# Patient Record
Sex: Female | Born: 1950
Health system: Southern US, Community
[De-identification: ages and names within clinical notes are randomized; demographics above are authoritative.]

## PROBLEM LIST (undated history)

## (undated) DIAGNOSIS — R519 Headache, unspecified: Secondary | ICD-10-CM

## (undated) DIAGNOSIS — G43909 Migraine, unspecified, not intractable, without status migrainosus: Secondary | ICD-10-CM

## (undated) DIAGNOSIS — M8580 Other specified disorders of bone density and structure, unspecified site: Secondary | ICD-10-CM

## (undated) DIAGNOSIS — M419 Scoliosis, unspecified: Secondary | ICD-10-CM

## (undated) DIAGNOSIS — R51 Headache: Secondary | ICD-10-CM

## (undated) DIAGNOSIS — M797 Fibromyalgia: Secondary | ICD-10-CM

## (undated) HISTORY — DX: Fibromyalgia: M79.7

## (undated) HISTORY — DX: Migraine, unspecified, not intractable, without status migrainosus: G43.909

## (undated) HISTORY — PX: LAPAROTOMY: SHX154

## (undated) HISTORY — PX: CYST EXCISION: SHX5701

## (undated) HISTORY — DX: Headache: R51

## (undated) HISTORY — DX: Other specified disorders of bone density and structure, unspecified site: M85.80

## (undated) HISTORY — DX: Headache, unspecified: R51.9

## (undated) HISTORY — DX: Scoliosis, unspecified: M41.9

---

## 2009-03-05 ENCOUNTER — Encounter: Admission: RE | Admit: 2009-03-05 | Discharge: 2009-03-05 | Payer: Self-pay | Admitting: Orthopedic Surgery

## 2009-03-20 ENCOUNTER — Encounter: Admission: RE | Admit: 2009-03-20 | Discharge: 2009-05-06 | Payer: Self-pay | Admitting: Orthopedic Surgery

## 2010-06-01 ENCOUNTER — Encounter: Admission: RE | Admit: 2010-06-01 | Discharge: 2010-06-01 | Payer: Self-pay | Admitting: Family Medicine

## 2010-07-04 ENCOUNTER — Emergency Department (HOSPITAL_COMMUNITY): Admission: EM | Admit: 2010-07-04 | Discharge: 2010-07-04 | Payer: Self-pay | Admitting: Emergency Medicine

## 2010-11-12 ENCOUNTER — Other Ambulatory Visit: Payer: Self-pay | Admitting: Family Medicine

## 2010-11-12 ENCOUNTER — Other Ambulatory Visit (HOSPITAL_COMMUNITY)
Admission: RE | Admit: 2010-11-12 | Discharge: 2010-11-12 | Disposition: A | Discharge status: Home / Self Care | Payer: MEDICARE | Source: Ambulatory Visit | Attending: Family Medicine | Admitting: Family Medicine

## 2010-11-12 ENCOUNTER — Encounter (INDEPENDENT_AMBULATORY_CARE_PROVIDER_SITE_OTHER): Payer: Self-pay | Admitting: *Deleted

## 2010-11-12 DIAGNOSIS — Z124 Encounter for screening for malignant neoplasm of cervix: Secondary | ICD-10-CM | POA: Insufficient documentation

## 2010-11-19 NOTE — Letter (Signed)
Summary: Pre Visit Letter Revised  [facility]  [address] [place], [place] [address]   Phone: [phone]  Fax: [phone]        11/12/2010 MRN: [id number] [facility] [address] [place], [place]  [address]             Procedure Date:  12-25-10   Welcome to the Gastroenterology Division at [facility].    You are scheduled to see a nurse for your pre-procedure visit on 12-12-10 at 1:00P.M. on the 3rd floor at [facility], [address].  We ask that you try to arrive at our office 15 minutes prior to your appointment time to allow for check-in.  Please take a minute to review the attached form.  If you answer "Yes" to one or more of the questions on the first page, we ask that you call the person listed at your earliest opportunity.  If you answer "No" to all of the questions, please complete the rest of the form and bring it to your appointment.    Your nurse visit will consist of discussing your medical and surgical history, your immediate family medical history, and your medications.   If you are unable to list all of your medications on the form, please bring the medication bottles to your appointment and we will list them.  We will need to be aware of both prescribed and over the counter drugs.  We will need to know exact dosage information as well.    Please be prepared to read and sign documents such as consent forms, a financial agreement, and acknowledgement forms.  If necessary, and with your consent, a friend or relative is welcome to sit-in on the nurse visit with you.  Please bring your insurance card so that we may make a copy of it.  If your insurance requires a referral to see a specialist, please bring your referral form from your primary care physician.  No co-pay is required for this nurse visit.     If you cannot keep your appointment, please call [phone] to cancel or reschedule prior to your appointment date.  This allows  [place] the opportunity to schedule an appointment for another patient in need of care.    Thank you for choosing [facility] for your medical needs.  We appreciate the opportunity to care for you.  Please visit [place] at our website  to learn more about our practice.  Sincerely, The Gastroenterology Division

## 2010-12-04 ENCOUNTER — Other Ambulatory Visit: Payer: Self-pay | Admitting: Family Medicine

## 2010-12-04 ENCOUNTER — Other Ambulatory Visit (HOSPITAL_COMMUNITY)
Admission: RE | Admit: 2010-12-04 | Discharge: 2010-12-04 | Disposition: A | Discharge status: Home / Self Care | Payer: MEDICARE | Source: Ambulatory Visit | Attending: Family Medicine | Admitting: Family Medicine

## 2010-12-04 DIAGNOSIS — Z124 Encounter for screening for malignant neoplasm of cervix: Secondary | ICD-10-CM | POA: Insufficient documentation

## 2010-12-11 LAB — DIFFERENTIAL
Basophils Absolute: 0 10*3/uL (ref 0.0–0.1)
Basophils Relative: 0 % (ref 0–1)
Eosinophils Absolute: 0 10*3/uL (ref 0.0–0.7)
Neutrophils Relative %: 60 % (ref 43–77)

## 2010-12-11 LAB — POCT I-STAT, CHEM 8
HCT: 41 % (ref 36.0–46.0)
Hemoglobin: 13.9 g/dL (ref 12.0–15.0)
Potassium: 3.8 mEq/L (ref 3.5–5.1)
Sodium: 145 mEq/L (ref 135–145)

## 2010-12-11 LAB — CBC
MCH: 31.8 pg (ref 26.0–34.0)
MCHC: 33.5 g/dL (ref 30.0–36.0)
Platelets: 144 10*3/uL — ABNORMAL LOW (ref 150–400)

## 2010-12-25 ENCOUNTER — Other Ambulatory Visit: Payer: Self-pay | Admitting: Internal Medicine

## 2012-01-27 ENCOUNTER — Other Ambulatory Visit (HOSPITAL_COMMUNITY)
Admission: RE | Admit: 2012-01-27 | Discharge: 2012-01-27 | Disposition: A | Discharge status: Home / Self Care | Payer: Medicare Other | Source: Ambulatory Visit | Attending: Family Medicine | Admitting: Family Medicine

## 2012-01-27 ENCOUNTER — Other Ambulatory Visit: Payer: Self-pay | Admitting: Family Medicine

## 2012-01-27 DIAGNOSIS — Z124 Encounter for screening for malignant neoplasm of cervix: Secondary | ICD-10-CM | POA: Insufficient documentation

## 2013-02-24 ENCOUNTER — Other Ambulatory Visit: Payer: Self-pay | Admitting: Neurology

## 2013-03-28 ENCOUNTER — Other Ambulatory Visit: Payer: Self-pay | Admitting: Neurology

## 2013-06-13 ENCOUNTER — Encounter: Payer: Self-pay | Admitting: Neurology

## 2013-06-13 ENCOUNTER — Ambulatory Visit (INDEPENDENT_AMBULATORY_CARE_PROVIDER_SITE_OTHER): Payer: Medicare Other | Admitting: Neurology

## 2013-06-13 VITALS — BP 152/90 | HR 72 | Ht 72.0 in | Wt 191.0 lb

## 2013-06-13 DIAGNOSIS — G43909 Migraine, unspecified, not intractable, without status migrainosus: Secondary | ICD-10-CM

## 2013-06-13 MED ORDER — TIZANIDINE HCL 4 MG PO TABS: 2.0000 mg | ORAL_TABLET | Freq: Three times a day (TID) | ORAL | Status: DC | PRN

## 2013-06-13 MED ORDER — BUTALBITAL-APAP-CAFFEINE 50-325-40 MG PO TABS: 1.0000 | ORAL_TABLET | Freq: Four times a day (QID) | ORAL | Status: DC | PRN

## 2013-06-13 MED ORDER — TOPIRAMATE 25 MG PO TABS: 100.0000 mg | ORAL_TABLET | Freq: Two times a day (BID) | ORAL | Status: DC

## 2013-06-13 NOTE — Progress Notes (Signed)
History of Present Illness:   Stephanie Finley is a 62 years old right-handed Caucasian female, referred by her primary care physician Dr. Wynonia Finley for evaluation of chronic migraine headaches.  She reported history of migraine since she was a kid, increased frequency over the past few years, she moved from [place] to [place] 4 years ago, was under the care of [facility] Dr. Catalina Finley until fall of 2012, she has left her practice,  Patient reported increased frequency of migraine, couple times a week, lasting one day to  one and half days, severe right retro-orbital area pounding headache, with associated light noise sensitivity, nausea, occasionally right side face, arm, leg numbness and weakness, to the point of limp, drop things out of her hand   She is currently taking topiramate 100 mg every day since 2007, does help her headaches, also takes tizanidine 4 mg one and half tablets to 4 tablets q.d. as needed for migraine, she is also taking a combination of Isometho/dichlor/apa, 65,/100/325 mg as needed for abortive treatment, she also takes baby aspirin, BC Porter, "whatever I can get my hands on", couple times a day for her frequent headaches.  She also reported she just throw away two boxes of previous medication, she has tried and failed propranolol, different beta blocker, antidepression in the past as preventive medication, she reported bad reaction to multiple triptan treatment in the past, including jaw and neck stiffness, swelling  She complains of upset stomach with her multiple medications now, she also complains of excessive stress at home, she lives with her sister, and brother-in-law, her sister has suffered chronic illness, she is the main care giver, her sister passed away in 2013/11/23she is not taking care of her brother-in-law, for a while, her headache was under good control since her last visit in April 2013.  But recent few weeks, she began to have flareup of her  migraine again, 3 times a week, lasting few hours, she been taking BC powder which helps her headaches, she tried triptan in the past, which was not helpful, hydrocodone causes rebound headaches  Review of Systems  Out of a complete 14 system review, the patient complains of only the following symptoms, and all other reviewed systems are negative.  Depression, anxiety.  Social History  Patient lives at home with her sister and brother in [place]. Patient is retired and has her B.S.  Patient smokes 1/2 pack of cigarettes daily. Patient denies any history of alcohol and illicit drugs. Patient drinks two cups of caffeine daily. Any Tobacco Use: Yes Inhaled Tobacco Use: Current every day smoker  Family History  Patients parents are both deceased.  Past Medical History  Fibromyalgia  Surgical History  1986-Cyst  Physical Exam  Neck: supple no carotid bruits Respiratory: clear to auscultation bilaterally Cardiovascular: regular rate rhythm  Neurologic Exam  Mental Status: anxious middle age female, awake, alert, cooperative to history, talking, and casual conversation. Cranial Nerves: CN II-XII pupils were equal round reactive to light, right cataract, I could not see right fundus, left fundus was normal.  Extraocular movements were full. She has right exophoria. Visual fields were full on confrontational test.  Facial sensation and strength were normal.  Hearing was intact to finger rubbing bilaterally.  Uvula tongue were midline.  Head turning and shoulder shrugging were normal and symmetric.  Tongue protrusion into the cheeks strength were normal.  Motor: Normal tone, bulk, and strength. Sensory: Normal to light touch, pinprick, proprioception, and vibratory sensation. Coordination:  Normal finger-to-nose, heel-to-shin.  There was no dysmetria noticed. Gait and Station: Narrow based and steady, was able to perform tiptoe, heel, and tandem walking without difficulty.  Romberg sign:  Negative Reflexes: Deep tendon reflexes: Biceps: 2/2, Brachioradialis: 2/2, Triceps: 2/2, Pateller: 2/2, Achilles: 2/2.  Plantar responses are flexor.   Assessment and plan:  62 years old right-handed Caucasian female,  with history of chronic migraine headaches, tried and failed multiple preventive medications,right cataract. otherwise normal neurological examination, reported multiple normal MRI of brain.  1. refill Topamax 25 mg 4 tablets twice a day, Fioricet as needed for abortive treatment,  2.  return to clinic in 6 months with Stephanie Finley

## 2013-07-27 ENCOUNTER — Other Ambulatory Visit: Payer: Self-pay

## 2013-07-27 DIAGNOSIS — Z1231 Encounter for screening mammogram for malignant neoplasm of breast: Secondary | ICD-10-CM

## 2013-08-28 ENCOUNTER — Ambulatory Visit: Payer: PRIVATE HEALTH INSURANCE

## 2013-08-29 ENCOUNTER — Ambulatory Visit
Admission: RE | Admit: 2013-08-29 | Discharge: 2013-08-29 | Disposition: A | Discharge status: Home / Self Care | Payer: Medicare Other | Source: Ambulatory Visit

## 2013-08-29 DIAGNOSIS — Z1231 Encounter for screening mammogram for malignant neoplasm of breast: Secondary | ICD-10-CM

## 2013-09-01 ENCOUNTER — Telehealth: Payer: Self-pay | Admitting: Neurology

## 2013-09-01 NOTE — Telephone Encounter (Signed)
Left message for patient to reschedule 3/17 appointment per Carolyn's schedule.

## 2013-12-04 ENCOUNTER — Ambulatory Visit (INDEPENDENT_AMBULATORY_CARE_PROVIDER_SITE_OTHER): Payer: Medicare Other | Admitting: Nurse Practitioner

## 2013-12-04 ENCOUNTER — Encounter (INDEPENDENT_AMBULATORY_CARE_PROVIDER_SITE_OTHER): Payer: Self-pay

## 2013-12-04 ENCOUNTER — Encounter: Payer: Self-pay | Admitting: Nurse Practitioner

## 2013-12-04 VITALS — BP 136/84 | HR 92 | Ht 67.0 in | Wt 194.0 lb

## 2013-12-04 DIAGNOSIS — G43909 Migraine, unspecified, not intractable, without status migrainosus: Secondary | ICD-10-CM

## 2013-12-04 MED ORDER — TOPIRAMATE 25 MG PO TABS: 100.0000 mg | ORAL_TABLET | Freq: Two times a day (BID) | ORAL | Status: DC

## 2013-12-04 NOTE — Progress Notes (Signed)
[facility]  PATIENT: Stephanie Finley DOB: 1951-01-10   REASON FOR VISIT: Followup for migraine   HISTORY OF PRESENT ILLNESS: Stephanie Finley, 63 year old female returns for followup. She was initially evaluated by Stephanie Finley 06/13/2013. She was placed on Topamax at that time and has had good response to the medication. She takes Fioricet acutely. She has tizanidine to take but she rarely takes the medication. She had no change in her headaches except that the frequency is much less. She has occasional side effects of Topamax with tingling in the face hands and feet. She returns for reevaluation.   HISTORY: evaluation of chronic migraine headaches.  She reported history of migraine since she was a kid, increased frequency over the past few years, she moved from [place] to [place] 4 years ago, was under the care of [facility] Stephanie Finley until fall of 2012, she has left her practice,  Patient reported increased frequency of migraine, couple times a week, lasting one day to one and half days, severe right retro-orbital area pounding headache, with associated light noise sensitivity, nausea, occasionally right side face, arm, leg numbness and weakness, to the point of limp, drop things out of her hand  She is currently taking topiramate 100 mg every day since 2007, does help her headaches, also takes tizanidine 4 mg one and half tablets to 4 tablets q.d. as needed for migraine, she is also taking a combination of Isometho/dichlor/apa, 65,/100/325 mg as needed for abortive treatment, she also takes baby aspirin, BC Porter, "whatever I can get my hands on", couple times a day for her frequent headaches.  She also reported she just throw away two boxes of previous medication, she has tried and failed propranolol, different beta blocker, antidepression in the past as preventive medication, she reported bad reaction to multiple triptan treatment in the past, including jaw and  neck stiffness, swelling  She complains of upset stomach with her multiple medications now, she also complains of excessive stress at home, she lives with her sister, and brother-in-law, her sister has suffered chronic illness, she is the main care giver, her sister passed away in Nov 13, 2013she is not taking care of her brother-in-law, for a while, her headache was under good control since her last visit in April 2013.  But recent few weeks, she began to have flareup of her migraine again, 3 times a week, lasting few hours, she been taking BC powder which helps her headaches, she tried triptan in the past, which was not helpful, hydrocodone causes rebound headaches   REVIEW OF SYSTEMS: Full 14 system review of systems performed and notable only for those listed, all others are neg:  Constitutional: N/A  Cardiovascular: N/A  Ear/Nose/Throat: Neck pain, neck stiffness,  Skin: N/A  Eyes: N/A  Respiratory: Cough Gastroitestinal: N/A  Hematology/Lymphatic: N/A  Endocrine: N/A Musculoskeletal:N/A  Allergy/Immunology: Allergies to foods  Neurological: Occasional headache, Psychiatric: N/A   ALLERGIES: Allergies  Allergen Reactions  . Penicillins     HOME MEDICATIONS: Outpatient Prescriptions Prior to Visit  Medication Sig Dispense Refill  . butalbital-acetaminophen-caffeine (FIORICET, ESGIC) 50-325-40 MG per tablet Take 1 tablet by mouth every 6 (six) hours as needed for headache.  60 tablet  5  . FLUoxetine (PROZAC) 10 MG tablet Take 15 mg by mouth daily.       . SYMBICORT 160-4.5 MCG/ACT inhaler Inhale 1 puff into the lungs as needed.       Marland Kitchen tiZANidine (ZANAFLEX) 4 MG tablet Take  0.5 tablets (2 mg total) by mouth every 8 (eight) hours as needed.  60 tablet  6  . topiramate (TOPAMAX) 25 MG tablet Take 4 tablets (100 mg total) by mouth 2 (two) times daily.  240 tablet  12  . VAGIFEM 10 MCG TABS vaginal tablet Place 10 mcg vaginally. Twice weekly       No facility-administered  medications prior to visit.    PAST MEDICAL HISTORY: History reviewed. No pertinent past medical history.  PAST SURGICAL HISTORY: Past Surgical History  Procedure Laterality Date  . Cyst excision      1986    FAMILY HISTORY: History reviewed. No pertinent family history.  SOCIAL HISTORY: History   Social History  . Marital Status: Divorced    Spouse Name: N/A    Number of Children: 0  . Years of Education: 12+   Occupational History  . Retired    Social History Main Topics  . Smoking status: Current Every Day Smoker    Types: Cigarettes  . Smokeless tobacco: Never Used  . Alcohol Use: No  . Drug Use: No  . Sexual Activity: Not on file   Other Topics Concern  . Not on file   Social History Narrative   Patient lives at home with her two sister and brother [place]. Patient is retired and has her B.S.    Caffeine - two cups daily.   Patient is divorced.    Patient has a college education.    Patient has no children.            PHYSICAL EXAM  Filed Vitals:   12/04/13 0936  BP: 136/84  Pulse: 92  Height: 5\' 7"  (1.702 m)  Weight: 194 lb (87.998 kg)   Body mass index is 30.38 kg/(m^2).  Generalized: Well developed, in no acute distress    Neurological examination   Mentation: Alert oriented to time, place, history taking. Follows all commands speech and language fluent  Cranial nerve II-XII: Pupils were equal round reactive to light extraocular movements were full, visual field were full on confrontational test. Facial sensation and strength were normal. hearing was intact to finger rubbing bilaterally. Uvula tongue midline. head turning and shoulder shrug were normal and symmetric.Tongue protrusion into cheek strength was normal. Motor: normal bulk and tone, full strength in the BUE, BLE,  No focal weakness Coordination: finger-nose-finger, heel-to-shin bilaterally, no dysmetria Reflexes: Brachioradialis 2/2, biceps 2/2, triceps 2/2, patellar 2/2,  Achilles 2/2, plantar responses were flexor bilaterally. Gait and Station: Rising up from seated position without assistance, normal stance,  moderate stride, good arm swing, smooth turning, able to perform tiptoe, and heel walking without difficulty. Tandem gait is steady  DIAGNOSTIC DATA (LABS, IMAGING, TESTING)     ASSESSMENT AND PLAN  63 y.o. year old female history of migraine headaches, has failed multiple preventives in the past but is currently on Topamax 100 mg daily with good results  Continue Topamax at current dose, will refill Continue Fioricet, acutely Continue Zanaflex and take twice a day if needed Followup in 6 months Nilda RiggsNancy Carolyn Martin, [facility] Hospital[facility]NP, [facility], APRN  [facility] [address], [address] [place], [place] [address] [phone]

## 2013-12-04 NOTE — Patient Instructions (Signed)
Continue Topamax at current dose, will refill Continue Fioricet, acutely Continue Zanaflex and take twice a day if needed Followup in 6 months

## 2013-12-12 ENCOUNTER — Ambulatory Visit: Payer: Medicare Other | Admitting: Nurse Practitioner

## 2014-02-26 ENCOUNTER — Encounter: Payer: Self-pay | Admitting: Nurse Practitioner

## 2014-06-08 ENCOUNTER — Ambulatory Visit: Payer: Medicare Other | Admitting: Nurse Practitioner

## 2014-06-23 ENCOUNTER — Other Ambulatory Visit: Payer: Self-pay | Admitting: Neurology

## 2014-07-02 ENCOUNTER — Encounter (INDEPENDENT_AMBULATORY_CARE_PROVIDER_SITE_OTHER): Payer: Self-pay

## 2014-07-02 ENCOUNTER — Encounter: Payer: Self-pay | Admitting: Nurse Practitioner

## 2014-07-02 ENCOUNTER — Ambulatory Visit (INDEPENDENT_AMBULATORY_CARE_PROVIDER_SITE_OTHER): Payer: Medicare Other | Admitting: Nurse Practitioner

## 2014-07-02 VITALS — BP 134/90 | HR 92 | Ht 67.0 in | Wt 191.4 lb

## 2014-07-02 DIAGNOSIS — G43909 Migraine, unspecified, not intractable, without status migrainosus: Secondary | ICD-10-CM

## 2014-07-02 MED ORDER — TOPIRAMATE 25 MG PO TABS: 100.0000 mg | ORAL_TABLET | Freq: Two times a day (BID) | ORAL | Status: DC

## 2014-07-02 NOTE — Patient Instructions (Signed)
Pt to continue Topamax at current dose will refill Call for worsening of headaches F/U 6 to 8 months

## 2014-07-02 NOTE — Progress Notes (Addendum)
[facility]  PATIENT: Stephanie Finley DOB: [birth year]   REASON FOR VISIT:follow up for migraine   HISTORY OF PRESENT ILLNESS:Stephanie Finley, 63 year old female returns for followup. She was last seen in this office 12/04/2013.She is on  Topamax  and has had good response to the medication. She takes Fioricet acutely. She has tizanidine to take but she rarely takes the medication. She had no change in her headaches except that the frequency is much less. She has occasional side effects of Topamax with tingling in the face hands and feet. She returns for reevaluation.   HISTORY: evaluation of chronic migraine headaches.  She reported history of migraine since she was a kid, increased frequency over the past few years, she moved from [place] to [place] 4 years ago, was under the care of [facility] Dr. Catalina Lunger until fall of 2012, she has left her practice,  Patient reported increased frequency of migraine, couple times a week, lasting one day to one and half days, severe right retro-orbital area pounding headache, with associated light noise sensitivity, nausea, occasionally right side face, arm, leg numbness and weakness, to the point of limp, drop things out of her hand  She is currently taking topiramate 100 mg every day since 2007, does help her headaches, also takes tizanidine 4 mg one and half tablets to 4 tablets q.d. as needed for migraine, she is also taking a combination of Isometho/dichlor/apa, 65,/100/325 mg as needed for abortive treatment, she also takes baby aspirin, BC Porter, "whatever I can get my hands on", couple times a day for her frequent headaches.  She also reported she just throw away two boxes of previous medication, she has tried and failed propranolol, different beta blocker, antidepression in the past as preventive medication, she reported bad reaction to multiple triptan treatment in the past, including jaw and neck stiffness, swelling    She complains of upset stomach with her multiple medications now, she also complains of excessive stress at home, she lives with her sister, and brother-in-law, her sister has suffered chronic illness, she is the main care giver, her sister passed away in 11-Nov-2013she is not taking care of her brother-in-law, for a while, her headache was under good control since her last visit in April 2013.  But recent few weeks, she began to have flareup of her migraine again, 3 times a week, lasting few hours, she been taking BC powder which helps her headaches, she tried triptan in the past, which was not helpful, hydrocodone causes rebound headaches   REVIEW OF SYSTEMS: Full 14 system review of systems performed and notable only for those listed, all others are neg:  Constitutional: N/A  Cardiovascular: N/A  Ear/Nose/Throat: N/A  Skin: N/A  Eyes: N/A  Respiratory: N/A  Gastroitestinal: N/A  Hematology/Lymphatic: N/A  Endocrine: N/A Musculoskeletal:N/A  Allergy/Immunology: N/A  Neurological: N/A Psychiatric: Anxiety  Sleep : NA   ALLERGIES: Allergies  Allergen Reactions  . Penicillins     HOME MEDICATIONS: Outpatient Prescriptions Prior to Visit  Medication Sig Dispense Refill  . butalbital-acetaminophen-caffeine (FIORICET, ESGIC) 50-325-40 MG per tablet Take 1 tablet by mouth every 6 (six) hours as needed for headache.  60 tablet  5  . FLUoxetine (PROZAC) 10 MG tablet Take 15 mg by mouth daily.       . SYMBICORT 160-4.5 MCG/ACT inhaler Inhale 1 puff into the lungs as needed.       Marland Kitchen tiZANidine (ZANAFLEX) 4 MG tablet Take 0.5 tablets (2 mg  total) by mouth every 8 (eight) hours as needed.  60 tablet  6  . topiramate (TOPAMAX) 25 MG tablet Take 4 tablets (100 mg total) by mouth 2 (two) times daily.  240 tablet  6  . topiramate (TOPAMAX) 25 MG tablet TAKE 4 TABLETS BY MOUTH TWICE DAILY  240 tablet  0  . VAGIFEM 10 MCG TABS vaginal tablet Place 10 mcg vaginally. Twice weekly       No  facility-administered medications prior to visit.    PAST MEDICAL HISTORY: History reviewed. No pertinent past medical history.  PAST SURGICAL HISTORY: Past Surgical History  Procedure Laterality Date  . Cyst excision      1986    FAMILY HISTORY: Family History  Problem Relation Age of Onset  . Cancer Mother   . Cancer Father     SOCIAL HISTORY: History   Social History  . Marital Status: Divorced    Spouse Name: N/A    Number of Children: 0  . Years of Education: 12+   Occupational History  . Retired    Social History Main Topics  . Smoking status: Current Every Day Smoker    Types: Cigarettes  . Smokeless tobacco: Never Used  . Alcohol Use: No  . Drug Use: No  . Sexual Activity: Not on file   Other Topics Concern  . Not on file   Social History Narrative   Patient lives at home [place] her two sister and [place]. Patient is retired and has her B.S.    Caffeine - two cups daily.   Patient is divorced.    Patient has a college education.    Patient has no children.            PHYSICAL EXAM  Filed Vitals:   07/02/14 1524  BP: 134/90  Pulse: 92  Height: 5\' 7"  (1.702 m)  Weight: 191 lb 6.4 oz (86.818 kg)   Body mass index is 29.97 kg/(m^2). Generalized: Well developed, in no acute distress  Neurological examination  Mentation: Alert oriented to time, place, history taking. Follows all commands speech and language fluent  Cranial nerve II-XII: Pupils were equal round reactive to light extraocular movements were full, visual field were full on confrontational test. Facial sensation and strength were normal. hearing was intact to finger rubbing bilaterally. Uvula tongue midline. head turning and shoulder shrug were normal and symmetric.Tongue protrusion into cheek strength was normal.  Motor: normal bulk and tone, full strength in the BUE, BLE, No focal weakness  Coordination: finger-nose-finger, heel-to-shin bilaterally, no dysmetria  Reflexes:  Brachioradialis 2/2, biceps 2/2, triceps 2/2, patellar 2/2, Achilles 2/2, plantar responses were flexor bilaterally.  Gait and Station: Rising up from seated position without assistance, normal stance, moderate stride, good arm swing, smooth turning, able to perform tiptoe, and heel walking without difficulty. Tandem gait is steady   DIAGNOSTIC DATA (LABS, IMAGING, TESTING) -ASSESSMENT AND PLAN  63 y.o. year old female  with  past medical history of migraines here to followup. She is a patient of Dr. Terrace ArabiaYan who is out of the office  Pt to continue Topamax at current dose will refill Call for worsening of headaches Continue Fioricet  and tizanidine as well F/U 6 to 8 months, next with Dr. Carmelina NounYan Armany Mano Carolyn Calyx Hawker, [facility]adeGNP, [facility]BC, APRN  [facility] [address] [place], [place] [address] [phone]  I reviewed the above note and documentation by the Nurse Practitioner and agree with the history, physical exam, assessment  and plan as outlined above. Huston Foley, MD, PhD [facility])

## 2014-08-15 ENCOUNTER — Encounter: Payer: Self-pay | Admitting: Neurology

## 2014-08-21 ENCOUNTER — Encounter: Payer: Self-pay | Admitting: Neurology

## 2014-08-27 ENCOUNTER — Other Ambulatory Visit: Payer: Self-pay | Admitting: Neurology

## 2014-08-31 ENCOUNTER — Other Ambulatory Visit: Payer: Self-pay | Admitting: Family Medicine

## 2014-08-31 DIAGNOSIS — M545 Low back pain, unspecified: Secondary | ICD-10-CM

## 2014-09-12 ENCOUNTER — Ambulatory Visit
Admission: RE | Admit: 2014-09-12 | Discharge: 2014-09-12 | Disposition: A | Discharge status: Home / Self Care | Payer: Medicare Other | Source: Ambulatory Visit | Attending: Family Medicine | Admitting: Family Medicine

## 2014-09-12 DIAGNOSIS — M545 Low back pain, unspecified: Secondary | ICD-10-CM

## 2014-10-24 ENCOUNTER — Ambulatory Visit: Payer: Medicare Other | Attending: Family Medicine

## 2014-10-24 DIAGNOSIS — M47896 Other spondylosis, lumbar region: Secondary | ICD-10-CM | POA: Insufficient documentation

## 2014-10-24 DIAGNOSIS — G43909 Migraine, unspecified, not intractable, without status migrainosus: Secondary | ICD-10-CM | POA: Insufficient documentation

## 2014-10-24 DIAGNOSIS — M5126 Other intervertebral disc displacement, lumbar region: Secondary | ICD-10-CM | POA: Insufficient documentation

## 2014-10-24 DIAGNOSIS — M797 Fibromyalgia: Secondary | ICD-10-CM | POA: Diagnosis not present

## 2014-10-24 DIAGNOSIS — M545 Low back pain: Secondary | ICD-10-CM | POA: Diagnosis present

## 2014-10-30 ENCOUNTER — Ambulatory Visit: Payer: Medicare Other | Attending: Family Medicine

## 2014-10-30 DIAGNOSIS — M47896 Other spondylosis, lumbar region: Secondary | ICD-10-CM | POA: Diagnosis not present

## 2014-10-30 DIAGNOSIS — M5126 Other intervertebral disc displacement, lumbar region: Secondary | ICD-10-CM | POA: Insufficient documentation

## 2014-10-30 DIAGNOSIS — G43909 Migraine, unspecified, not intractable, without status migrainosus: Secondary | ICD-10-CM | POA: Diagnosis not present

## 2014-10-30 DIAGNOSIS — M797 Fibromyalgia: Secondary | ICD-10-CM | POA: Insufficient documentation

## 2014-10-30 DIAGNOSIS — M545 Low back pain: Secondary | ICD-10-CM | POA: Diagnosis present

## 2014-11-01 ENCOUNTER — Ambulatory Visit: Payer: Medicare Other | Admitting: Physical Therapy

## 2014-11-01 DIAGNOSIS — M545 Low back pain: Secondary | ICD-10-CM | POA: Diagnosis not present

## 2014-11-06 ENCOUNTER — Ambulatory Visit: Payer: Medicare Other

## 2014-11-06 DIAGNOSIS — M545 Low back pain: Secondary | ICD-10-CM | POA: Diagnosis not present

## 2014-11-08 ENCOUNTER — Ambulatory Visit: Payer: Medicare Other

## 2014-11-08 DIAGNOSIS — M545 Low back pain: Secondary | ICD-10-CM | POA: Diagnosis not present

## 2014-11-13 ENCOUNTER — Ambulatory Visit: Payer: Medicare Other

## 2014-11-13 DIAGNOSIS — M545 Low back pain, unspecified: Secondary | ICD-10-CM

## 2014-11-13 DIAGNOSIS — R6889 Other general symptoms and signs: Secondary | ICD-10-CM

## 2014-11-13 NOTE — Therapy (Signed)
[facility] [place], [place], [place], [address] Phone: [phone]   Fax:  [phone]  Physical Therapy Treatment  Patient Details  Name: Stephanie Finley MRN: [id number] Date of Birth: 25-May-1951 Referring Provider:  Marjorie Smolder, MD  Encounter Date: 11/13/2014      PT End of Session - 11/13/14 1437    Visit Number 6   Date for PT Re-Evaluation 12/14/14   PT Start Time 9983   PT Stop Time 1507   PT Time Calculation (min) 64 min   Activity Tolerance Patient tolerated treatment well   Behavior During Therapy [facility] for tasks assessed/performed      Past Medical History  Diagnosis Date  . Headache   . Fibromyalgia   . Osteopenia   . Fibromyalgia     Past Surgical History  Procedure Laterality Date  . Cyst excision      1986    There were no vitals taken for this visit.  Visit Diagnosis:  Bilateral low back pain without sciatica  Activity intolerance      Subjective Assessment - 11/13/14 1408    Symptoms Back is not feeling too bad.  Used good mechanics to take trash cans to the curb   Currently in Pain? No/denies   Multiple Pain Sites No          [facility] PT Assessment - 11/13/14 0001    Assessment   Medical Diagnosis LBP with several disc protrusions, spondylosis   Precautions   Precautions None   Balance Screen   Has the patient fallen in the past 6 months No   Has the patient had a decrease in activity level because of a fear of falling?  No   Is the patient reluctant to leave their home because of a fear of falling?  No   Home Environment   Living Enviornment Private residence   Prior Function   Level of Independence Independent with basic ADLs   Cognition   Overall Cognitive Status Within Functional Limits for tasks assessed                  [facility] Adult PT Treatment/Exercise - 11/13/14 0001    Exercises   Exercises Lumbar;Knee/Hip;Shoulder   Lumbar Exercises:  Stretches   Piriformis Stretch 3 reps;20 seconds  seated figure 4   Lumbar Exercises: Aerobic   Stationary Bike Level 1x 8 minutes   Lumbar Exercises: Standing   Other Standing Lumbar Exercises mini tram: weight shifting with abdominal bracing.  3x 1 minutes   Lumbar Exercises: Seated   Other Seated Lumbar Exercises seated on green ball: 3 way raises 2# 2x10   Lumbar Exercises: Supine   Ab Set 5 seconds;10 reps   Knee/Hip Exercises: Stretches   Active Hamstring Stretch 3 reps;20 seconds  performed seated   Shoulder Exercises: Supine   Horizontal ABduction Strengthening;20 reps   Theraband Level (Shoulder Horizontal ABduction) Level 2 (Red)  performed supine with abdominal bracing   Flexion Strengthening;20 reps   Theraband Level (Shoulder Flexion) Level 2 (Red)  D2 performed bilaterally   Shoulder Exercises: ROM/Strengthening   UBE (Upper Arm Bike) Level 1x 6 minutes (3 min/3 min)   seated on green ball   Modalities   Modalities Electrical Stimulation;Moist Heat   Moist Heat Therapy   Number Minutes Moist Heat 15 Minutes   Moist Heat Location --  Lumbar   Electrical Stimulation   Electrical Stimulation Location low back   Electrical Stimulation Action IFC  Electrical Stimulation Goals Pain                  PT Short Term Goals - 11/13/14 1359    PT SHORT TERM GOAL #1   Title be independent in initial HEP   Time 4   Period Weeks   Status Achieved   PT SHORT TERM GOAL #2   Title stand to cook with 25% less lumbar pain   Time 4   Period Weeks   Status Achieved   PT SHORT TERM GOAL #3   Title negotiate steps or incline with 20% less lumbar pain   Time 4   Period Weeks   Status Achieved           PT Long Term Goals - 11/13/14 1400    PT LONG TERM GOAL #1   Title demonstrate and verbalize correct body mechanics   Time 8   Period Weeks   Status Achieved   PT LONG TERM GOAL #2   Title be independent iwth advanced HEP   Time 8   Period Weeks    Status New   PT LONG TERM GOAL #3   Title reduce FOTO to < or = to 43% limitation   Time 8   Period Weeks   Status New   PT LONG TERM GOAL #4   Title stand to cook with 60% less lumbar pain   Time 8   Period Weeks   Status New   PT LONG TERM GOAL #5   Title negotiate steps or lincline with 40% less lumbar pain   Time 8   Period Weeks   Status New   Additional Long Term Goals   Additional Long Term Goals Yes   PT LONG TERM GOAL #6   Title initiate a regular exercis routine to include swimming or walking   Time 8   Period Weeks   Status New               Plan - 11/13/14 1410    Clinical Impression Statement Pt reports 50% overall improvement in lumbar pain with standing and negotiating steps- STG met.  Pt has not returned to swimming as it has been too cold.  Pt with increased tolerance for ADLs and self-care.   Pt will benefit from skilled therapeutic intervention in order to improve on the following deficits Decreased activity tolerance;Pain   Rehab Potential Good   PT Frequency 2x / week   PT Duration 8 weeks   PT Treatment/Interventions Electrical Stimulation;Therapeutic exercise;Patient/family education;Moist Heat;Neuromuscular re-education;Therapeutic activities   PT Next Visit Plan Core strength, endurance, flexibility and modalities PRN   Consulted and Agree with Plan of Care Patient        Problem List Patient Active Problem List   Diagnosis Date Noted  . Migraine 06/13/2013    Alonni Heimsoth, PT 11/13/2014, 2:42 PM  [facility] [address] [place], [place], [place], [address] Phone: [phone]   Fax:  [phone]

## 2014-11-15 ENCOUNTER — Ambulatory Visit: Payer: Medicare Other | Admitting: Physical Therapy

## 2014-11-15 ENCOUNTER — Encounter: Payer: Self-pay | Admitting: Physical Therapy

## 2014-11-15 DIAGNOSIS — M545 Low back pain, unspecified: Secondary | ICD-10-CM

## 2014-11-15 DIAGNOSIS — R6889 Other general symptoms and signs: Secondary | ICD-10-CM

## 2014-11-15 NOTE — Therapy (Signed)
[facility] [address] [place] [place] [address] [place], [place], [address] Phone: [phone]   Fax:  [phone]  Physical Therapy Treatment  Patient Details  Name: Stephanie Finley MRN: [id number] Date of Birth: June 06, 1951 Referring Provider:  Hollice Espy, MD  Encounter Date: 11/15/2014      PT End of Session - 11/15/14 1628    Visit Number 7   Date for PT Re-Evaluation 12/14/14   PT Start Time 1530   PT Stop Time 1634   PT Time Calculation (min) 64 min   Activity Tolerance Patient tolerated treatment well   Behavior During Therapy [facility] for tasks assessed/performed      Past Medical History  Diagnosis Date  . Headache   . Fibromyalgia   . Osteopenia   . Fibromyalgia     Past Surgical History  Procedure Laterality Date  . Cyst excision      1986    There were no vitals taken for this visit.  Visit Diagnosis:  Bilateral low back pain without sciatica  Activity intolerance      Subjective Assessment - 11/15/14 1533    Symptoms My back is goog today, except that little sore space   Currently in Pain? No/denies   Multiple Pain Sites No                    [facility] Adult PT Treatment/Exercise - 11/15/14 0001    Exercises   Exercises Shoulder;Lumbar;Knee/Hip   Lumbar Exercises: Stretches   Piriformis Stretch 3 reps;20 seconds  seated figure 4   Lumbar Exercises: Standing   Other Standing Lumbar Exercises mini tram: weight shifting with abdominal bracing.  3x 1 minutes   Lumbar Exercises: Seated   Other Seated Lumbar Exercises seated on green ball: 3 way raises 2# 2x10   Other Seated Lumbar Exercises triceps sitting on green physioball   Lumbar Exercises: Quadruped   Plank modified    Knee/Hip Exercises: Stretches   Active Hamstring Stretch 3 reps;20 seconds  performed seated   Shoulder Exercises: Supine   Horizontal ABduction Strengthening;20 reps   Theraband Level (Shoulder Horizontal ABduction) Level  2 (Red)  performed supine with abdominal bracing   Flexion Strengthening;20 reps   Theraband Level (Shoulder Flexion) Level 2 (Red)  D2 performed bilaterally   Shoulder Exercises: Standing   Other Standing Exercises --   Shoulder Exercises: ROM/Strengthening   UBE (Upper Arm Bike) Level 1x 6 minutes (3 min/3 min)   seated on green ball   Modalities   Modalities Electrical Stimulation;Moist Heat   Moist Heat Therapy   Number Minutes Moist Heat 15 Minutes   Moist Heat Location Other (comment)   Electrical Stimulation   Electrical Stimulation Location low back   Electrical Stimulation Action IFC   Electrical Stimulation Goals Pain                  PT Short Term Goals - 11/13/14 1359    PT SHORT TERM GOAL #1   Title be independent in initial HEP   Time 4   Period Weeks   Status Achieved   PT SHORT TERM GOAL #2   Title stand to cook with 25% less lumbar pain   Time 4   Period Weeks   Status Achieved   PT SHORT TERM GOAL #3   Title negotiate steps or incline with 20% less lumbar pain   Time 4   Period Weeks   Status Achieved  PT Long Term Goals - 11/13/14 1400    PT LONG TERM GOAL #1   Title demonstrate and verbalize correct body mechanics   Time 8   Period Weeks   Status Achieved   PT LONG TERM GOAL #2   Title be independent iwth advanced HEP   Time 8   Period Weeks   Status New   PT LONG TERM GOAL #3   Title reduce FOTO to < or = to 43% limitation   Time 8   Period Weeks   Status New   PT LONG TERM GOAL #4   Title stand to cook with 60% less lumbar pain   Time 8   Period Weeks   Status New   PT LONG TERM GOAL #5   Title negotiate steps or lincline with 40% less lumbar pain   Time 8   Period Weeks   Status New   Additional Long Term Goals   Additional Long Term Goals Yes   PT LONG TERM GOAL #6   Title initiate a regular exercis routine to include swimming or walking   Time 8   Period Weeks   Status New                Problem List Patient Active Problem List   Diagnosis Date Noted  . Migraine 06/13/2013    NAUMANN-HOUEGNIFIO,Jonquil Stubbe PTA  11/15/2014, 4:30 PM  [facility] [address], [address] [place], [place], [address] Phone: [phone]   Fax:  [phone]

## 2014-11-20 ENCOUNTER — Ambulatory Visit: Payer: Medicare Other | Admitting: Physical Therapy

## 2014-11-20 ENCOUNTER — Encounter: Payer: Self-pay | Admitting: Physical Therapy

## 2014-11-20 DIAGNOSIS — M545 Low back pain, unspecified: Secondary | ICD-10-CM

## 2014-11-20 DIAGNOSIS — R6889 Other general symptoms and signs: Secondary | ICD-10-CM

## 2014-11-20 NOTE — Therapy (Signed)
[facility] [address], [address] [place], [place], [address] Phone: [phone]   Fax:  [phone]  Physical Therapy Treatment  Patient Details  Name: Maurianna Benard MRN: [id number] Date of Birth: [birth year] Referring Provider:  Hollice Espy, MD  Encounter Date: 11/20/2014      PT End of Session - 11/20/14 1511    Visit Number 8   Number of Visits 16   Date for PT Re-Evaluation 12/14/14   PT Start Time 1401   PT Stop Time 1503   PT Time Calculation (min) 62 min   Activity Tolerance Patient tolerated treatment well   Behavior During Therapy [facility] for tasks assessed/performed      Past Medical History  Diagnosis Date  . Headache   . Fibromyalgia   . Osteopenia   . Fibromyalgia     Past Surgical History  Procedure Laterality Date  . Cyst excision      1986    There were no vitals taken for this visit.  Visit Diagnosis:  Bilateral low back pain without sciatica  Activity intolerance      Subjective Assessment - 11/20/14 1507    Limitations Standing   How long can you stand comfortably? limited to                    [facility] Adult PT Treatment/Exercise - 11/20/14 0001    Lumbar Exercises: Stretches   Active Hamstring Stretch 3 reps;20 seconds  in supine with towel & in sitting   Single Knee to Chest Stretch 3 reps;20 seconds   Double Knee to Chest Stretch 3 reps;20 seconds   Pelvic Tilt 60 seconds  sitting on gree ball ant/post & side/side   Piriformis Stretch 3 reps;20 seconds  seated figure 4   Lumbar Exercises: Aerobic   UBE (Upper Arm Bike) level 1 x6' (3'/3)    Lumbar Exercises: Standing   Other Standing Lumbar Exercises mini tram: weight shifting with abdominal bracing.  3x 1 minutes   Lumbar Exercises: Seated   Other Seated Lumbar Exercises seated on green ball, red t-band 3 x 10 bil abductio , D2    Modalities   Modalities Electrical Stimulation;Moist Heat   Moist Heat  Therapy   Number Minutes Moist Heat 15 Minutes   Moist Heat Location Other (comment)   Electrical Stimulation   Electrical Stimulation Location low back   Electrical Stimulation Action IFC   Electrical Stimulation Goals Pain                  PT Short Term Goals - 11/13/14 1359    PT SHORT TERM GOAL #1   Title be independent in initial HEP   Time 4   Period Weeks   Status Achieved   PT SHORT TERM GOAL #2   Title stand to cook with 25% less lumbar pain   Time 4   Period Weeks   Status Achieved   PT SHORT TERM GOAL #3   Title negotiate steps or incline with 20% less lumbar pain   Time 4   Period Weeks   Status Achieved           PT Long Term Goals - 11/13/14 1400    PT LONG TERM GOAL #1   Title demonstrate and verbalize correct body mechanics   Time 8   Period Weeks   Status Achieved   PT LONG TERM GOAL #2   Title be independent iwth advanced HEP  Time 8   Period Weeks   Status New   PT LONG TERM GOAL #3   Title reduce FOTO to < or = to 43% limitation   Time 8   Period Weeks   Status New   PT LONG TERM GOAL #4   Title stand to cook with 60% less lumbar pain   Time 8   Period Weeks   Status New   PT LONG TERM GOAL #5   Title negotiate steps or lincline with 40% less lumbar pain   Time 8   Period Weeks   Status New   Additional Long Term Goals   Additional Long Term Goals Yes   PT LONG TERM GOAL #6   Title initiate a regular exercis routine to include swimming or walking   Time 8   Period Weeks   Status New               Plan - 11/20/14 1512    Clinical Impression Statement Pt. reports after vacuum this morning flare up of back pain rated as 3/10   Pt will benefit from skilled therapeutic intervention in order to improve on the following deficits Decreased activity tolerance;Decreased strength;Pain   Rehab Potential Good   PT Frequency 2x / week   PT Duration 8 weeks   PT Treatment/Interventions Moist Heat;Therapeutic  exercise;Electrical Stimulation;ADLs/Self Care Home Management;Therapeutic activities;Patient/family education   PT Next Visit Plan core strength, endurance, flexibility and modalities   Consulted and Agree with Plan of Care Patient        Problem List Patient Active Problem List   Diagnosis Date Noted  . Migraine 06/13/2013    NAUMANN-HOUEGNIFIO,Kirby Argueta PTA  11/20/2014, 3:17 PM   [facility] [address], [address] [place], [place], [address] Phone: [phone]   Fax:  [phone]

## 2014-11-22 ENCOUNTER — Encounter: Payer: Medicare Other | Admitting: Physical Therapy

## 2014-11-27 ENCOUNTER — Ambulatory Visit: Payer: Medicare Other | Attending: Family Medicine | Admitting: Physical Therapy

## 2014-11-27 ENCOUNTER — Encounter: Payer: Self-pay | Admitting: Physical Therapy

## 2014-11-27 DIAGNOSIS — M545 Low back pain, unspecified: Secondary | ICD-10-CM

## 2014-11-27 DIAGNOSIS — M797 Fibromyalgia: Secondary | ICD-10-CM | POA: Insufficient documentation

## 2014-11-27 DIAGNOSIS — R6889 Other general symptoms and signs: Secondary | ICD-10-CM

## 2014-11-27 DIAGNOSIS — M5126 Other intervertebral disc displacement, lumbar region: Secondary | ICD-10-CM | POA: Insufficient documentation

## 2014-11-27 DIAGNOSIS — M47896 Other spondylosis, lumbar region: Secondary | ICD-10-CM | POA: Diagnosis not present

## 2014-11-27 DIAGNOSIS — G43909 Migraine, unspecified, not intractable, without status migrainosus: Secondary | ICD-10-CM | POA: Insufficient documentation

## 2014-11-27 NOTE — Therapy (Signed)
[facility] [address] [place], [place], [address] Phone: [phone]   Fax:  [phone]  Physical Therapy Treatment  Patient Details  Name: Stephanie PedMary Finley MRN: [id number] Date of Birth: July 12, 1951 Referring Provider:  Hollice EspyGates, Donna Ruth, MD  Encounter Date: 11/27/2014      PT End of Session - 11/27/14 1547    Visit Number 9   Number of Visits 16   Date for PT Re-Evaluation 12/14/14   PT Start Time 1445   PT Stop Time 1547   PT Time Calculation (min) 62 min   Activity Tolerance Patient tolerated treatment well   Behavior [facility] for tasks assessed/performed      Past Medical History  Diagnosis Date  . Headache   . Fibromyalgia   . Osteopenia   . Fibromyalgia     Past Surgical History  Procedure Laterality Date  . Cyst excision      1986    There were no vitals taken for this visit.  Visit Diagnosis:  Bilateral low back pain without sciatica  Activity intolerance      Subjective Assessment - 11/27/14 1452    Symptoms My back is great   Currently in Pain? No/denies  My back is much better   Pain Score 0-No pain   Multiple Pain Sites No                    [facility] Adult PT Treatment/Exercise - 11/27/14 0001    Exercises   Exercises Lumbar;Shoulder   Lumbar Exercises: Stretches   Active Hamstring Stretch 3 reps;20 seconds  each leg in sitting   Active Hamstring Stretch 3 reps  in sitting   Lumbar Exercises: Aerobic   UBE (Upper Arm Bike) sitting on green ball 6' (3/3)   Lumbar Exercises: Supine   Bridge 20 reps  red t-band into UE horizontal abd    Other Supine Lumbar Exercises Green ball D2 x 10 each side   Lumbar Exercises: Quadruped   Plank modified x 5 with 10 sec hold   Shoulder Exercises: Supine   Flexion AROM   Theraband Level (Shoulder Flexion) Level 2 (Red)  sitting on green ball   Shoulder Exercises: Power [employer]   Row 20 reps  with 25# with focus on  abdominal activation   Modalities   Modalities Electrical Stimulation   Moist Heat Therapy   Number Minutes Moist Heat 15 Minutes   Electrical Stimulation   Electrical Stimulation Location low back   Electrical Stimulation Action IFC   Electrical Stimulation Parameters 80-150Hz    Electrical Stimulation Goals Pain                PT Education - 11/27/14 1538    Education provided Yes   Education Details D2 unilateral UE   Person(s) Educated Patient   Methods Demonstration   Comprehension Returned demonstration          PT Short Term Goals - 11/13/14 1359    PT SHORT TERM GOAL #1   Title be independent in initial HEP   Time 4   Period Weeks   Status Achieved   PT SHORT TERM GOAL #2   Title stand to cook with 25% less lumbar pain   Time 4   Period Weeks   Status Achieved   PT SHORT TERM GOAL #3   Title negotiate steps or incline with 20% less lumbar pain   Time 4   Period Weeks   Status  Achieved           PT Long Term Goals - 11/13/14 1400    PT LONG TERM GOAL #1   Title demonstrate and verbalize correct body mechanics   Time 8   Period Weeks   Status Achieved   PT LONG TERM GOAL #2   Title be independent iwth advanced HEP   Time 8   Period Weeks   Status New   PT LONG TERM GOAL #3   Title reduce FOTO to < or = to 43% limitation   Time 8   Period Weeks   Status New   PT LONG TERM GOAL #4   Title stand to cook with 60% less lumbar pain   Time 8   Period Weeks   Status New   PT LONG TERM GOAL #5   Title negotiate steps or lincline with 40% less lumbar pain   Time 8   Period Weeks   Status New   Additional Long Term Goals   Additional Long Term Goals Yes   PT LONG TERM GOAL #6   Title initiate a regular exercis routine to include swimming or walking   Time 8   Period Weeks   Status New               Plan - 11/27/14 1548    Clinical Impression Statement Pt. reports feeling great today   Pt will benefit from skilled therapeutic  intervention in order to improve on the following deficits Decreased activity tolerance;Decreased strength;Pain   Rehab Potential Good   PT Frequency 2x / week   PT Duration 8 weeks   PT Treatment/Interventions Moist Heat;Therapeutic exercise;Electrical Stimulation;ADLs/Self Care Home Management;Therapeutic activities;Patient/family education   PT Next Visit Plan core strength, endurance, flexibility and modalities   Consulted and Agree with Plan of Care Patient        Problem List Patient Active Problem List   Diagnosis Date Noted  . Migraine 06/13/2013    NAUMANN-HOUEGNIFIO,Sariah Henkin PTA 11/27/2014, 3:51 PM  [facility] [address] [place], [place], [address] Phone: [phone]   Fax:  [phone]

## 2014-11-27 NOTE — Patient Instructions (Signed)
(  Home) PNF: D2 Flexion - Unilateral   Opposite side toward anchor, right arm down, across body, thumb down, pull arm up and out, rotating to thumb up. Follow hand with head and eyes. Repeat _10___ times per set. Do __2-3_ sets per session. Do _5___ sessions per week. Use ___red t-band.  Copyright  VHI. All rights reserved.

## 2014-11-29 ENCOUNTER — Ambulatory Visit: Payer: Medicare Other | Admitting: Physical Therapy

## 2014-11-29 ENCOUNTER — Encounter: Payer: Self-pay | Admitting: Physical Therapy

## 2014-11-29 DIAGNOSIS — M545 Low back pain, unspecified: Secondary | ICD-10-CM

## 2014-11-29 DIAGNOSIS — R6889 Other general symptoms and signs: Secondary | ICD-10-CM

## 2014-11-29 NOTE — Therapy (Signed)
[facility] [address] [place], [place], [address] Phone: [phone]   Fax:  [phone]  Physical Therapy Treatment  Patient Details  Name: Stephanie Finley MRN: [id number] Date of Birth: October 24, 1950 Referring Provider:  Hollice Espy, MD  Encounter Date: 11/29/2014      PT End of Session - 11/29/14 1541    Visit Number 10   Number of Visits 16   Date for PT Re-Evaluation 12/14/14   PT Start Time 1446   PT Stop Time 1531   PT Time Calculation (min) 45 min   Behavior During Therapy [facility] for tasks assessed/performed      Past Medical History  Diagnosis Date  . Headache   . Fibromyalgia   . Osteopenia   . Fibromyalgia     Past Surgical History  Procedure Laterality Date  . Cyst excision      1986    There were no vitals taken for this visit.  Visit Diagnosis:  Bilateral low back pain without sciatica  Activity intolerance      Subjective Assessment - 11/29/14 1449    Symptoms My back is great   Limitations Standing   How long can you stand comfortably? standing 25-48min                    [facility] Adult PT Treatment/Exercise - 11/29/14 0001    Bed Mobility   Bed Mobility Sit to Supine  practiced proper techniques with transfers   Lumbar Exercises: Stretches   Active Hamstring Stretch 3 reps;20 seconds  each leg in sitting   Single Knee to Chest Stretch 3 reps;20 seconds   Lower Trunk Rotation 3 reps;20 seconds  each side   Pelvic Tilt 60 seconds  sitting on green physioball   Piriformis Stretch 3 reps;20 seconds  in sitting   Lumbar Exercises: Aerobic   UBE (Upper Arm Bike) sitting on green ball 6' (3/3)   Lumbar Exercises: Seated   Other Seated Lumbar Exercises green ball bil horizontal abd, D2 each side 3 x 10 red band   Lumbar Exercises: Supine   Bridge 20 reps  x 5 sec hold   Lumbar Exercises: Quadruped   Plank modified x 5 with 10 sec hold                   PT Short Term Goals - 11/29/14 1451    PT SHORT TERM GOAL #1   Title be independent in initial HEP   Status Achieved   PT SHORT TERM GOAL #2   Status Achieved           PT Long Term Goals - 11/29/14 1452    PT LONG TERM GOAL #1   Title demonstrate and verbalize correct body mechanics   PT LONG TERM GOAL #4   Title stand to cook with 60% less lumbar pain  Pt reports 30-40% improvement, progressing toward LTG   Status On-going   PT LONG TERM GOAL #5   Title negotiate steps or lincline with 40% less lumbar pain  Pt rates it as 50% improvement   Status Achieved   PT LONG TERM GOAL #6   Title initiate a regular exercis routine to include swimming or walking   Status On-going               Plan - 11/29/14 1548    Consulted and Agree with Plan of Care Patient        Problem List  Patient Active Problem List   Diagnosis Date Noted  . Migraine 06/13/2013    NAUMANN-HOUEGNIFIO,Minnetta Sandora PTA 11/29/2014, 3:49 PM  [facility] [address] [place], [place], [address] Phone: [phone]   Fax:  [phone]

## 2014-12-04 ENCOUNTER — Ambulatory Visit: Payer: Medicare Other | Admitting: Physical Therapy

## 2014-12-04 ENCOUNTER — Encounter: Payer: Self-pay | Admitting: Physical Therapy

## 2014-12-04 DIAGNOSIS — R6889 Other general symptoms and signs: Secondary | ICD-10-CM

## 2014-12-04 DIAGNOSIS — M545 Low back pain, unspecified: Secondary | ICD-10-CM

## 2014-12-04 NOTE — Patient Instructions (Signed)
Bracing With Bridging (Hook-Lying)   With neutral spine, tighten pelvic floor and abdominals and hold. Lift bottom. Repeat _10__ times. Do 3___ times a day. Hold for 5 sec    Copyright  VHI. All rights reserved.

## 2014-12-04 NOTE — Therapy (Signed)
[facility] [address], [address] [place], [place], [address] Phone: [phone]   Fax:  [phone]  Physical Therapy Treatment  Patient Details  Name: Stephanie PedMary Rachel MRN: [id number] Date of Birth: 08-Oct-1950 Referring Provider:  Shaune PollackGates, Donna, MD  Encounter Date: 12/04/2014      PT End of Session - 12/04/14 1547    Visit Number 11   Number of Visits 20   Date for PT Re-Evaluation 12/14/14   PT Start Time 1440   PT Stop Time 1529   PT Time Calculation (min) 49 min   Activity Tolerance Patient tolerated treatment well   Behavior During Therapy [facility] for tasks assessed/performed      Past Medical History  Diagnosis Date  . Headache   . Fibromyalgia   . Osteopenia   . Fibromyalgia     Past Surgical History  Procedure Laterality Date  . Cyst excision      1986    There were no vitals taken for this visit.  Visit Diagnosis:  Bilateral low back pain without sciatica  Activity intolerance      Subjective Assessment - 12/04/14 1453    Symptoms No complain of pain, but last week pain in thoracic area for two days   Limitations Standing   How long can you stand comfortably? standing 45 min   Currently in Pain? No/denies   Pain Score 0-No pain   Pain Location Back   Pain Descriptors / Indicators Aching;Sore   Pain Type Chronic pain   Pain Onset Today   Pain Frequency Occasional   Aggravating Factors  to much activities    Effect of Pain on Daily Activities adjust or limit functional activities   Multiple Pain Sites --                    [facility] Adult PT Treatment/Exercise - 12/04/14 0001    Bed Mobility   Bed Mobility Supine to Sit  to right side, verbally reviewed proper technique   Lumbar Exercises: Stretches   Active Hamstring Stretch 3 reps;20 seconds  each leg in sitting   Pelvic Tilt 60 seconds  sitting on green physioball ant/post & side/side   Piriformis Stretch 3 reps;20 seconds  in  sitting   Lumbar Exercises: Aerobic   UBE (Upper Arm Bike) green ball 8' (4/4)   Lumbar Exercises: Supine   Bridge 20 reps;5 seconds   Other Supine Lumbar Exercises Foam Roll x 2 min for elongation & decompression    Shoulder Exercises: Power [employer]   Row 25 reps;5 reps  35# 3x10 sitting on green ball rhomboids, and horizontal abd                PT Education - 12/04/14 1545    Education Details Bridging   Person(s) Educated Patient   Methods Explanation;Handout   Comprehension Returned demonstration;Verbal cues required          PT Short Term Goals - 11/29/14 1451    PT SHORT TERM GOAL #1   Title be independent in initial HEP   Status Achieved   PT SHORT TERM GOAL #2   Status Achieved           PT Long Term Goals - 12/04/14 1549    PT LONG TERM GOAL #1   Title demonstrate and verbalize correct body mechanics   Period Weeks   Status Achieved   PT LONG TERM GOAL #2   Title be independent iwth advanced HEP  Time 8   Period Weeks   Status On-going   PT LONG TERM GOAL #3   Title reduce FOTO to < or = to 43% limitation   Time 8   Period Weeks   Status On-going   PT LONG TERM GOAL #4   Title stand to cook with 60% less lumbar pain   Time 8   Period Weeks   Status On-going   PT LONG TERM GOAL #5   Title negotiate steps or lincline with 40% less lumbar pain   Time 8   Period Weeks   Status Achieved   PT LONG TERM GOAL #6   Title initiate a regular exercis routine to include swimming or walking   Time 8   Period Weeks   Status On-going               Plan - 12/04/14 1516    Clinical Impression Statement Pt. all over feeling better, but sometimes pain in mid thoracic/low back area   Pt will benefit from skilled therapeutic intervention in order to improve on the following deficits Decreased activity tolerance;Decreased strength;Pain   Rehab Potential Good   PT Frequency 2x / week   PT Duration 8 weeks   PT Treatment/Interventions Moist  Heat;Therapeutic exercise;Electrical Stimulation;Therapeutic activities   PT Next Visit Plan continue with core strength, endurance, flexibility and modalities   Consulted and Agree with Plan of Care Patient        Problem List Patient Active Problem List   Diagnosis Date Noted  . Migraine 06/13/2013    NAUMANN-HOUEGNIFIO,Ivorie Uplinger PTA  12/04/2014, 3:52 PM  [facility] [address], [address] [place], [place], [address] Phone: [phone]   Fax:  [phone]

## 2014-12-06 ENCOUNTER — Ambulatory Visit: Payer: Medicare Other | Admitting: Physical Therapy

## 2014-12-11 ENCOUNTER — Ambulatory Visit: Payer: Medicare Other | Admitting: Physical Therapy

## 2014-12-11 ENCOUNTER — Encounter: Payer: Self-pay | Admitting: Physical Therapy

## 2014-12-11 DIAGNOSIS — R6889 Other general symptoms and signs: Secondary | ICD-10-CM

## 2014-12-11 DIAGNOSIS — M545 Low back pain, unspecified: Secondary | ICD-10-CM

## 2014-12-11 NOTE — Therapy (Signed)
[facility] [address], [address] [place], [place], [address] Phone: [phone]   Fax:  [phone]  Physical Therapy Treatment  Patient Details  Name: Stephanie Finley MRN: [id number] Date of Birth: 07/11/1951 Referring Provider:  Shaune Pollack, MD  Encounter Date: 12/11/2014      PT End of Session - 12/11/14 1515    Visit Number 12   Number of Visits 20   Date for PT Re-Evaluation 12/14/14   PT Start Time 1443   PT Stop Time 1529   PT Time Calculation (min) 46 min   Activity Tolerance Patient tolerated treatment well   Behavior During Therapy [facility] for tasks assessed/performed      Past Medical History  Diagnosis Date  . Headache   . Fibromyalgia   . Osteopenia   . Fibromyalgia     Past Surgical History  Procedure Laterality Date  . Cyst excision      1986    There were no vitals filed for this visit.  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 standing 45 min   Currently in Pain? No/denies   Pain Score 0-No pain   Pain Location Back   Pain Orientation Mid   Pain Descriptors / Indicators Aching;Sore   Multiple Pain Sites No                       [facility] Adult PT Treatment/Exercise - 12/11/14 0001    Lumbar Exercises: Stretches   Active Hamstring Stretch 3 reps;20 seconds  each leg in sitting   Pelvic Tilt 60 seconds  sitting on green physioball ant/post & side/side   Piriformis Stretch 3 reps;20 seconds  in sitting   Lumbar Exercises: Aerobic   Stationary Bike L1 x   UBE (Upper Arm Bike) green ball 6' (3/3)   Lumbar Exercises: Supine   Bridge 20 reps   Other Supine Lumbar Exercises Foam roll red t-band   into overhead flexion, and marching  for abdominal strength    Other Supine Lumbar Exercises Foam Roll x 2 min for elongation & decompression                   PT Short Term Goals - 11/29/14 1451    PT SHORT TERM GOAL #1   Title be independent in initial HEP   Status Achieved   PT SHORT TERM GOAL #2   Status Achieved           PT Long Term Goals - 12/11/14 1509    PT LONG TERM GOAL #1   Title demonstrate and verbalize correct body mechanics   Time 8   Period Weeks   Status Achieved   PT LONG TERM GOAL #2   Title be independent iwth advanced HEP   Time 8   Period Weeks   Status On-going   PT LONG TERM GOAL #3   Title reduce FOTO to < or = to 43% limitation   Time 8   Period Weeks   Status On-going   PT LONG TERM GOAL #4   Title stand to cook with 60% less lumbar pain   Time 8   Period Weeks   Status Achieved  PT LONG TERM GOAL #5   Title negotiate steps or lincline with 40% less lumbar pain   Time 8   Period Weeks   Status Achieved   PT LONG TERM GOAL #6   Title initiate a regular exercis routine to include swimming or walking   Time 8   Period Weeks   Status On-going               Plan - 12/11/14 1516    Clinical Impression Statement Pt reports feels  stronger, and noticed improved endurance, but still limited    Pt will benefit from skilled therapeutic intervention in order to improve on the following deficits Decreased activity tolerance;Decreased strength;Pain   Rehab Potential Good   PT Frequency 2x / week   PT Duration 8 weeks   PT Treatment/Interventions Moist Heat;Therapeutic exercise;Electrical Stimulation;Therapeutic activities   PT Next Visit Plan continue with core strength, endurance, flexibility and modalities   Consulted and Agree with Plan of Care Patient        Problem List Patient Active Problem List   Diagnosis Date Noted  . Migraine 06/13/2013    NAUMANN-HOUEGNIFIO,Vannia Pola PTA 12/11/2014, 3:24 PM  [facility] [address], [address] [place], [place], [address] Phone: [phone]   Fax:  [phone]

## 2014-12-13 ENCOUNTER — Ambulatory Visit: Payer: Medicare Other

## 2014-12-13 DIAGNOSIS — M545 Low back pain, unspecified: Secondary | ICD-10-CM

## 2014-12-13 DIAGNOSIS — R6889 Other general symptoms and signs: Secondary | ICD-10-CM

## 2014-12-13 NOTE — Patient Instructions (Signed)
Issued green theraband to HEP to advance exercises

## 2014-12-13 NOTE — Therapy (Signed)
[facility] [address], [place], [place], [address] Phone: [phone]   Fax:  [phone]  Physical Therapy Treatment  Patient Details  Name: Stephanie Finley MRN: [id number] Date of Birth: Nov 24, 1950 Referring Provider:  Darcus Austin, MD  Encounter Date: 12/13/2014    Past Medical History  Diagnosis Date  . Headache   . Fibromyalgia   . Osteopenia   . Fibromyalgia     Past Surgical History  Procedure Laterality Date  . Cyst excision      1986    There were no vitals filed for this visit.  Visit Diagnosis:  Bilateral low back pain without sciatica  Activity intolerance      Subjective Assessment - 12/13/14 1443    Symptoms I feel 100% better.  Ready to d/C to HEP   Currently in Pain? No/denies   Pain Score 0-No pain            [facility] PT Assessment - 12/13/14 0001    Observation/Other Assessments   Focus on Therapeutic Outcomes (FOTO)  24% limitation                   [facility] Adult PT Treatment/Exercise - 12/13/14 0001    Lumbar Exercises: Stretches   Active Hamstring Stretch 3 reps;20 seconds  each leg in sitting   Piriformis Stretch 3 reps;20 seconds  in sitting   Lumbar Exercises: Aerobic   Stationary Bike L1 x 42mn   UBE (Upper Arm Bike) green ball 6' (3/3)   Lumbar Exercises: Supine   Other Supine Lumbar Exercises Foam roll red t-band   into overhead flexion, and marching for abdominal strength    Other Supine Lumbar Exercises Foam Roll x 5 min for elongation & decompression    Shoulder Exercises: Supine   Protraction Strengthening;Both;20 reps  Green theraband issued for supine horizontal abd. and D2                  PT Short Term Goals - 11/29/14 1451    PT SHORT TERM GOAL #1   Title be independent in initial HEP   Status Achieved   PT SHORT TERM GOAL #2   Status Achieved           PT Long Term Goals - 12/13/14 1443    PT LONG TERM GOAL #1   Title  demonstrate and verbalize correct body mechanics   Status Achieved   PT LONG TERM GOAL #2   Title be independent iwth advanced HEP   Period Weeks   Status Achieved   PT LONG TERM GOAL #3   Title reduce FOTO to < or = to 43% limitation   Status Achieved  24% limitation   PT LONG TERM GOAL #4   Title stand to cook with 60% less lumbar pain   Status Achieved  100% improvement   PT LONG TERM GOAL #5   Title negotiate steps or lincline with 40% less lumbar pain   Status Achieved   PT LONG TERM GOAL #6   Title initiate a regular exercis routine to include swimming or walking   Status On-going  No formal routine at this time               Plan - 12/13/14 1508    Clinical Impression Statement Pt has met all goals and will be discharged to HEP.    PT Next Visit Plan D/C to HEP and gym exercises   Consulted and Agree with  Plan of Care Patient          G-Codes - Jan 07, 2015 1451    Functional Assessment Tool Used FOTO: 24% limitation   Functional Limitation Other PT primary   Other PT Primary Goal Status (B3383) At least 40 percent but less than 60 percent impaired, limited or restricted   Other PT Primary Discharge Status [phone]) At least 20 percent but less than 40 percent impaired, limited or restricted      Problem List Patient Active Problem List   Diagnosis Date Noted  . Migraine 06/13/2013  PHYSICAL THERAPY DISCHARGE SUMMARY  Visits from Start of Care: 13  Current functional level related to goals / functional outcomes: See above for goal status.  Pt denies any functional limitations at this time.     Remaining deficits: Mild LBP and endurance limitations with excessive standing.  Pt has a HEP and gym program in place and plans to continue at home and the [facility] after discharge.  Thank you for this referral.     Education / Equipment: Body mechanics, HEP Plan: Patient agrees to discharge.  Patient goals were met. Patient is being discharged due to meeting the  stated rehab goals.  ?????      TAKACS,KELLY , PT  01-07-15, 3:16 PM  [facility] [address] [place], [place], [address] Phone: [phone]   Fax:  [phone]

## 2015-01-01 ENCOUNTER — Ambulatory Visit (INDEPENDENT_AMBULATORY_CARE_PROVIDER_SITE_OTHER): Payer: Medicare Other | Admitting: Neurology

## 2015-01-01 ENCOUNTER — Encounter: Payer: Self-pay | Admitting: Neurology

## 2015-01-01 VITALS — BP 141/88 | HR 88 | Ht 67.0 in | Wt 193.0 lb

## 2015-01-01 DIAGNOSIS — G43909 Migraine, unspecified, not intractable, without status migrainosus: Secondary | ICD-10-CM

## 2015-01-01 DIAGNOSIS — F514 Sleep terrors [night terrors]: Secondary | ICD-10-CM

## 2015-01-01 MED ORDER — NORTRIPTYLINE HCL 10 MG PO CAPS
ORAL_CAPSULE | ORAL | Status: DC
Start: 2015-01-01 — End: 2015-10-14

## 2015-01-01 NOTE — Patient Instructions (Signed)
Magnesium oxide 400 mg twice a day Riboflavin  100 mg twice a day 

## 2015-01-01 NOTE — Progress Notes (Signed)
[facility]  PATIENT: Stephanie Finley DOB: October 03, 1950   REASON FOR VISIT:follow up for migraine   HISTORY OF PRESENT ILLNESS:Stephanie Finley, 64 year old female returns for followup. She was last seen in this office 12/04/2013.She is on  Topamax  and has had good response to the medication. She takes Fioricet acutely. She has tizanidine to take but she rarely takes the medication. She had no change in her headaches except that the frequency is much less. She has occasional side effects of Topamax with tingling in the face hands and feet. She returns for reevaluation.   HISTORY: evaluation of chronic migraine headaches.  She reported history of migraine since she was a kid, increased frequency over the past few years, she moved from [place] to [place] 4 years ago, was under the care of [facility] Lun[facility] until fall of 2012, she has left her practice,  Patient reported increased frequency of migraine, couple times a week, lasting one day to one and half days, severe right retro-orbital area pounding headache, with associated light noise sensitivity, nausea, occasionally right side face, arm, leg numbness and weakness, to the point of limp, drop things out of her hand   She is currently taking topiramate 100 mg every day since 2007, does help her headaches, also takes tizanidine 4 mg one and half tablets to 4 tablets q.d. as needed for migraine, she is also taking a combination of Isometho/dichlor/apa, 65,/100/325 mg as needed for abortive treatment, she also takes baby aspirin, BC Porter, "whatever I can get my hands on", couple times a day for her frequent headaches.   She also reported she just throw away two boxes of previous medication, she has tried and failed propranolol, different beta blocker, antidepression in the past as preventive medication, she reported bad reaction to multiple triptan treatment in the past, including jaw and neck stiffness,  swelling   She complains of upset stomach with her multiple medications now, she also complains of excessive stress at home,   But recent few weeks, she began to have flareup of her migraine again, 3 times a week, lasting few hours, she been taking BC powder which helps her headaches, she tried triptan in the past, which was not helpful, hydrocodone causes rebound headaches  UPDATE April 5th 2016: She is over all doing well, taking topamax  4 tab bid, Firiocet prn, Tizandidine prn, she has tried multiple treatment in the past, does not work for her, She used to only have 2-3 migraines each months, but since February 2016, she is having 2 to 3 headaches each week,   In addition, she recently complains of frequent awakening from nightmare, terrified, about every other day, she reported a history of childhood night terror often followed by a morning severe migraine headaches ,  REVIEW OF SYSTEMS: Full 14 system review of systems performed and notable only for those listed, all others are neg: Back pain, insomnia, frequent awakening,    ALLERGIES: Allergies  Allergen Reactions  . Penicillins     HOME MEDICATIONS: Outpatient Prescriptions Prior to Visit  Medication Sig Dispense Refill  . butalbital-acetaminophen-caffeine (FIORICET, ESGIC) 50-325-40 MG per tablet TAKE 1 TABLET BY MOUTH EVERY 6 HOURS AS NEEDED FOR HEADACHE 60 tablet 5  . SYMBICORT 160-4.5 MCG/ACT inhaler Inhale 1 puff into the lungs as needed.     Marland Kitchen tiZANidine (ZANAFLEX) 4 MG tablet Take 0.5 tablets (2 mg total) by mouth every 8 (eight) hours as needed. 60 tablet 6  . topiramate (  TOPAMAX) 25 MG tablet Take 4 tablets (100 mg total) by mouth 2 (two) times daily. 240 tablet 6  . VAGIFEM 10 MCG TABS vaginal tablet Place 10 mcg vaginally. Twice weekly    . FLUoxetine (PROZAC) 10 MG tablet Take 15 mg by mouth daily.      No facility-administered medications prior to visit.    PAST MEDICAL HISTORY: Past Medical History    Diagnosis Date  . Headache   . Fibromyalgia   . Osteopenia   . Fibromyalgia     PAST SURGICAL HISTORY: Past Surgical History  Procedure Laterality Date  . Cyst excision      1986    FAMILY HISTORY: Family History  Problem Relation Age of Onset  . Cancer Mother   . Cancer Father     SOCIAL HISTORY: History   Social History  . Marital Status: Divorced    Spouse Name: N/A  . Number of Children: 0  . Years of Education: 12+   Occupational History  . Retired    Social History Main Topics  . Smoking status: Current Every Day Smoker    Types: Cigarettes  . Smokeless tobacco: Never Used  . Alcohol Use: No  . Drug Use: No  . Sexual Activity: Not on file   Other Topics Concern  . Not on file   Social History Narrative   Patient lives at home with her two sister and brother in [place]. Patient is retired and has her B.S.    Caffeine - two cups daily.   Patient is divorced.    Patient has a college education.    Patient has no children.            PHYSICAL EXAM  Filed Vitals:   01/01/15 1151  BP: 141/88  Pulse: 88  Height:  (1.702 m)  Weight: 193 lb (87.544 kg)   Body mass index is 30.22 kg/(m^2).  PHYSICAL EXAMNIATION:  Gen: NAD, conversant, well nourised, obese, well groomed                     Cardiovascular: Regular rate rhythm, no peripheral edema, warm, nontender. Eyes: Conjunctivae clear without exudates or hemorrhage Neck: Supple, no carotid bruise. Pulmonary: Clear to auscultation bilaterally   NEUROLOGICAL EXAM:  MENTAL STATUS: Speech:    Speech is normal; fluent and spontaneous with normal comprehension.  Cognition:    The patient is oriented to person, place, and time;     recent and remote memory intact;     language fluent;     normal attention, concentration,     fund of knowledge.  CRANIAL NERVES: CN II: Visual fields are full to confrontation. Fundoscopic exam is normal with sharp discs and no vascular changes. Venous  pulsations are present bilaterally. Pupils are 4 mm and briskly reactive to light. Visual acuity is 20/20 bilaterally. CN III, IV, VI: extraocular movement are normal. No ptosis. Bilateral exophoria CN V: Facial sensation is intact to pinprick in all 3 divisions bilaterally. Corneal responses are intact.  CN VII: Face is symmetric with normal eye closure and smile. CN VIII: Hearing is normal to rubbing fingers CN IX, X: Palate elevates symmetrically. Phonation is normal. CN XI: Head turning and shoulder shrug are intact CN XII: Tongue is midline with normal movements and no atrophy.  MOTOR: There is no pronator drift of out-stretched arms. Muscle bulk and tone are normal. Muscle strength is normal.   Shoulder abduction Shoulder external rotation Elbow flexion Elbow extension  Wrist flexion Wrist extension Finger abduction Hip flexion Knee flexion Knee extension Ankle dorsi flexion Ankle plantar flexion  R 5 5 5 5 5 5 5 5 5 5 5 5   L $RemoveBefor [id number]$5 5 5 5 5 5 5 5 5 5 5 5    eps, triceps, knees, and ankles. Plantar responses are flexor.  SENSORY: Light touch, pinprick, position sense, and vibration sense are intact in fingers and toes.  COORDINATION: Rapid alternating movements and fine finger movements are intact. There is no dysmetria on finger-to-nose and heel-knee-shin. There are no abnormal or extraneous movements.   GAIT/STANCE: Posture is normal. Gait is steady with normal steps, base, arm swing, and turning. Heel and toe walking are normal. Tandem gait is normal.  Romberg is absent.    DIAGNOSTIC DATA (LABS, IMAGING, TESTING) -ASSESSMENT AND PLAN  64 y.o. year old female  with  past medical history of migraines here to followup. Today she also complains of frequent night terror, woke up from bad dreams, terrified, often followed by a morning headaches,  Pt to continue Topamax at current dose 25 mg 4 tablets twice a day Call for worsening of  headaches Continue Fioricet  and tizanidine as well If she continue complains of frequent awakening with nightmares, may consider EEG, I have add on nortriptyline 10 mg titrating to 20 mg every night Return to clinic with Eber Jonesarolyn in 3 months   Levert FeinsteinYijun Chennel Olivos, M.D. Ph.D.  [facility] [address] [place], [place] [address] Phone: [phone] Fax:      [phone]

## 2015-01-09 ENCOUNTER — Ambulatory Visit (INDEPENDENT_AMBULATORY_CARE_PROVIDER_SITE_OTHER): Payer: Medicare Other | Admitting: Neurology

## 2015-01-09 DIAGNOSIS — G43909 Migraine, unspecified, not intractable, without status migrainosus: Secondary | ICD-10-CM | POA: Diagnosis not present

## 2015-01-09 DIAGNOSIS — F514 Sleep terrors [night terrors]: Secondary | ICD-10-CM

## 2015-01-14 NOTE — Procedures (Signed)
   HISTORY: 64 year old female, with past medical history of migraine, now complains of night terrors, followed by headaches,  TECHNIQUE:  16 channel EEG was performed based on standard 10-16 international system. One channel was dedicated to EKG, which has demonstrates normal sinus rhythm of 84 beats per minutes.  Upon awakening, the posterior background activity was well-developed, in alpha range,reactive to eye opening and closure, there was frequent eye blinking muscle artifact  There was no evidence of epileptiform discharge.  Photic stimulation was performed, which induced a symmetric photic driving.  Hyperventilation was performed, there was no abnormality elicit.  No sleep was achieved.  CONCLUSION: This is a  normal awake EEG.  There is no electrodiagnostic evidence of epileptiform discharge

## 2015-03-06 ENCOUNTER — Ambulatory Visit: Payer: Medicare Other | Admitting: Nurse Practitioner

## 2015-04-11 ENCOUNTER — Ambulatory Visit (INDEPENDENT_AMBULATORY_CARE_PROVIDER_SITE_OTHER): Payer: Medicare Other | Admitting: Nurse Practitioner

## 2015-04-11 ENCOUNTER — Encounter: Payer: Self-pay | Admitting: Nurse Practitioner

## 2015-04-11 VITALS — BP 111/74 | HR 100 | Ht 66.0 in | Wt 194.5 lb

## 2015-04-11 DIAGNOSIS — G43909 Migraine, unspecified, not intractable, without status migrainosus: Secondary | ICD-10-CM | POA: Diagnosis not present

## 2015-04-11 NOTE — Patient Instructions (Signed)
Continue Topamax 100 mg daily Continue nortriptyline 20 mg at night Continue tizanidine when necessary Copies EEG given to patient it was normal Follow-up in 6 months

## 2015-04-11 NOTE — Progress Notes (Signed)
[facility]  PATIENT: Stephanie Finley DOB: 02-17-1951   REASON FOR VISIT: Follow-up for migraine, night terrors  HISTORY FROM: Patient    HISTORY OF PRESENT ILLNESS: HISTORY: evaluation of chronic migraine headaches.  She reported history of migraine since she was a kid, increased frequency over the past few years, [place] ago, was under the care of [facility] Dr. Catalina LungerHagan until fall of 2012, she has left her practice,  Patient reported increased frequency of migraine, couple times a week, lasting one day to one and half days, severe right retro-orbital area pounding headache, with associated light noise sensitivity, nausea, occasionally right side face, arm, leg numbness and weakness, to the point of limp, drop things out of her hand  She is currently taking topiramate 100 mg every day since 2007, does help her headaches, also takes tizanidine 4 mg one and half tablets to 4 tablets q.d. as needed for migraine, she is also taking a combination of Isometho/dichlor/apa, 65,/100/325 mg as needed for abortive treatment, she also takes baby aspirin, BC Porter, "whatever I can get my hands on", couple times a day for her frequent headaches.  She also reported she just throw away two boxes of previous medication, she has tried and failed propranolol, different beta blocker, antidepression in the past as preventive medication, she reported bad reaction to multiple triptan treatment in the past, including jaw and neck stiffness, swelling  She complains of upset stomach with her multiple medications now, she also complains of excessive stress at home,  But recent few weeks, she began to have flareup of her migraine again, 3 times a week, lasting few hours, she been taking BC powder which helps her headaches, she tried triptan in the past, which was not helpful, hydrocodone causes rebound headaches  UPDATE April 5th 2016:She is over  all doing well, taking topamax 25mg  4 tab bid, Firiocet prn, Tizandidine prn, she has tried multiple treatment in the past, does not work for her, She used to only have 2-3 migraines each months, but since February 2016, she is having 2 to 3 headaches each week,  In addition, she recently complains of frequent awakening from nightmare, terrified, about every other day, she reported a history of childhood night terror often followed by a morning severe migraine headaches UPDATE 04/11/2015 Stephanie Finley, 64 year old female returns for follow-up. She has history of migraines currently having about 2-3 headaches per month down from 2-3 a week when last seen by Dr. Terrace ArabiaYan. She recently moved into her own space. She was living with her brother-in-law and sister. She feels that has a lot to do with her decreased stress and decrease in migraines. She also takes nortriptyline at bedtime. She hasn't complained of episodes of night terror bad dreams which occurred frequently however that is infrequent  Now. EEG performed after her last visit was normal. She returns for reevaluation  REVIEW OF SYSTEMS: Full 14 system review of systems performed and notable only for those listed, all others are neg:  Constitutional: neg  Cardiovascular: neg Ear/Nose/Throat: neg  Skin: neg Eyes: neg Respiratory: Cough Gastroitestinal: neg  Hematology/Lymphatic: neg  Endocrine: neg Musculoskeletal: Back pain Allergy/Immunology: neg Neurological: Headaches Psychiatric: Anxiety Sleep : Frequent waking   ALLERGIES: Allergies  Allergen Reactions  . Penicillins     HOME MEDICATIONS: Outpatient Prescriptions Prior to Visit  Medication Sig Dispense Refill  . butalbital-acetaminophen-caffeine (FIORICET, ESGIC) 50-325-40 MG per tablet TAKE 1 TABLET BY MOUTH EVERY  6 HOURS AS NEEDED FOR HEADACHE 60 tablet 5  . FLUoxetine (PROZAC) 20 MG capsule Take 20 mg by mouth daily.   2  . nortriptyline (PAMELOR) 10 MG capsule One po qhs  xone week, then 2 tabs po qhs 60 capsule 11  . SYMBICORT 160-4.5 MCG/ACT inhaler Inhale 1 puff into the lungs as needed.     Marland Kitchen tiZANidine (ZANAFLEX) 4 MG tablet Take 0.5 tablets (2 mg total) by mouth every 8 (eight) hours as needed. 60 tablet 6  . topiramate (TOPAMAX) 25 MG tablet Take 4 tablets (100 mg total) by mouth 2 (two) times daily. (Patient taking differently: Take 100 mg by mouth daily. ) 240 tablet 6  . VAGIFEM 10 MCG TABS vaginal tablet Place 10 mcg vaginally. Twice weekly     No facility-administered medications prior to visit.    PAST MEDICAL HISTORY: Past Medical History  Diagnosis Date  . Headache   . Fibromyalgia   . Osteopenia   . Fibromyalgia     PAST SURGICAL HISTORY: Past Surgical History  Procedure Laterality Date  . Cyst excision      1986    FAMILY HISTORY: Family History  Problem Relation Age of Onset  . Cancer Mother   . Cancer Father     SOCIAL HISTORY: History   Social History  . Marital Status: Divorced    Spouse Name: N/A  . Number of Children: 0  . Years of Education: 12+   Occupational History  . Retired    Social History Main Topics  . Smoking status: Current Every Day Smoker -- 1.00 packs/day    Types: Cigarettes  . Smokeless tobacco: Never Used  . Alcohol Use: No  . Drug Use: No  . Sexual Activity: Not on file   Other Topics Concern  . Not on file   Social History Narrative   Patient lives at home alone w/cat.   Patient is retired and has her B.S.    Caffeine - two cups daily.   Patient is divorced.    Patient has a college education.    Patient has no children.            PHYSICAL EXAM  Filed Vitals:   04/11/15 1427  BP: 111/74  Pulse: 100  Height:  (1.676 m)  Weight: 194 lb 8 oz (88.225 kg)   Body mass index is 31.41 kg/(m^2).  Generalized: Well developed, in no acute distress  Head: normocephalic and atraumatic,. Oropharynx benign  Neck: Supple, no carotid bruits  Musculoskeletal: No deformity    Neurological examination   Mentation: Alert oriented to time, place, history taking. Attention span and concentration appropriate. Recent and remote memory intact.  Follows all commands speech and language fluent.   Cranial nerve II-XII: Fundoscopic exam reveals sharp disc margins.Pupils were equal round reactive to light extraocular movements were full, visual field were full on confrontational test. Facial sensation and strength were normal. hearing was intact to finger rubbing bilaterally. Uvula tongue midline. head turning and shoulder shrug were normal and symmetric.Tongue protrusion into cheek strength was normal. Motor: normal bulk and tone, full strength in the BUE, BLE, fine finger movements normal, no pronator drift. No focal weakness Coordination: finger-nose-finger, heel-to-shin bilaterally, no dysmetria Reflexes: Brachioradialis 2/2, biceps 2/2, triceps 2/2, patellar 2/2, Achilles 2/2, plantar responses were flexor bilaterally. Gait and Station: Rising up from seated position without assistance, normal stance,  moderate stride, good arm swing, smooth turning, able to perform tiptoe, and heel walking without difficulty.  Tandem gait is steady  DIAGNOSTIC DATA (LABS, IMAGING, TESTING) -  ASSESSMENT AND PLAN  64 y.o. year old female  has a past medical history of Headache;  and night terror which is much better. She is having 2-3 headaches a month.  Continue Topamax 100 mg daily Continue nortriptyline 20 mg at night Continue tizanidine when necessary Copies EEG given to patient it was normal I spent additional 15 minutes in total face to face time with the patient more than 50% of which was spent counseling and coordination of care, reviewing test results reviewing medications and discussing and reviewing the diagnosis of migraine and further treatment options. Importance of keeping a diary if headaches worsen to include the time of the headache what you're doing any other specific  information that would be useful. Discussed stress relief techniques such as deep breathing muscle relaxation, mental relaxation to music. Discussed importance of exercise, regular meals  and sleep. Sleep deprivation can be a migraine trigger Follow-up in 6 months Vst time 25 min Nilda Riggs, [facility], [facility], APRN  [facility] [address] [place], [place] [address] [phone]

## 2015-04-22 NOTE — Progress Notes (Signed)
I have reviewed and agreed above plan. 

## 2015-08-07 ENCOUNTER — Other Ambulatory Visit: Payer: Self-pay

## 2015-08-07 ENCOUNTER — Other Ambulatory Visit: Payer: Self-pay | Admitting: Family Medicine

## 2015-08-07 ENCOUNTER — Other Ambulatory Visit (HOSPITAL_COMMUNITY)
Admission: RE | Admit: 2015-08-07 | Discharge: 2015-08-07 | Disposition: A | Discharge status: Home / Self Care | Payer: Medicare Other | Source: Ambulatory Visit | Attending: Family Medicine | Admitting: Family Medicine

## 2015-08-07 DIAGNOSIS — Z1231 Encounter for screening mammogram for malignant neoplasm of breast: Secondary | ICD-10-CM

## 2015-08-07 DIAGNOSIS — Z01411 Encounter for gynecological examination (general) (routine) with abnormal findings: Secondary | ICD-10-CM | POA: Diagnosis present

## 2015-08-08 ENCOUNTER — Ambulatory Visit: Payer: Medicare Other

## 2015-08-08 LAB — CYTOLOGY - PAP

## 2015-08-29 ENCOUNTER — Ambulatory Visit
Admission: RE | Admit: 2015-08-29 | Discharge: 2015-08-29 | Disposition: A | Discharge status: Home / Self Care | Payer: Medicare Other | Source: Ambulatory Visit

## 2015-08-29 DIAGNOSIS — Z1231 Encounter for screening mammogram for malignant neoplasm of breast: Secondary | ICD-10-CM

## 2015-10-14 ENCOUNTER — Encounter: Payer: Self-pay | Admitting: Nurse Practitioner

## 2015-10-14 ENCOUNTER — Ambulatory Visit (INDEPENDENT_AMBULATORY_CARE_PROVIDER_SITE_OTHER): Payer: Medicare Other | Admitting: Nurse Practitioner

## 2015-10-14 VITALS — BP 168/92 | HR 81 | Ht 66.0 in | Wt 213.6 lb

## 2015-10-14 DIAGNOSIS — G43909 Migraine, unspecified, not intractable, without status migrainosus: Secondary | ICD-10-CM | POA: Diagnosis not present

## 2015-10-14 MED ORDER — BUTALBITAL-APAP-CAFFEINE 50-325-40 MG PO TABS: ORAL_TABLET | ORAL | Status: DC

## 2015-10-14 MED ORDER — TOPIRAMATE 25 MG PO TABS: 100.0000 mg | ORAL_TABLET | Freq: Two times a day (BID) | ORAL | Status: DC

## 2015-10-14 NOTE — Progress Notes (Signed)
I have reviewed and agreed above plan. 

## 2015-10-14 NOTE — Progress Notes (Signed)
[facility]  PATIENT: Stephanie Finley DOB: 05-15-1951   REASON FOR VISIT: Follow-up for migraine HISTORY FROM: Patient    HISTORY OF PRESENT ILLNESS: HISTORY: YYevaluation of chronic migraine headaches.  She reported history of migraine since she was a kid, increased frequency over the past few years, she moved from [place] 4 years ago, was under the care of [facility] Dr. Catalina LungerHagan until fall of 2012, she has left her practice,  Patient reported increased frequency of migraine, couple times a week, lasting one day to one and half days, severe right retro-orbital area pounding headache, with associated light noise sensitivity, nausea, occasionally right side face, arm, leg numbness and weakness, to the point of limp, drop things out of her hand  She is currently taking topiramate 100 mg every day since 2007, does help her headaches, also takes tizanidine 4 mg one and half tablets to 4 tablets q.d. as needed for migraine, she is also taking a combination of Isometho/dichlor/apa, 65,/100/325 mg as needed for abortive treatment, she also takes baby aspirin, BC Porter, "whatever I can get my hands on", couple times a day for her frequent headaches.  She also reported she just throw away two boxes of previous medication, she has tried and failed propranolol, different beta blocker, antidepression in the past as preventive medication, she reported bad reaction to multiple triptan treatment in the past, including jaw and neck stiffness, swelling  She complains of upset stomach with her multiple medications now, she also complains of excessive stress at home,  But recent few weeks, she began to have flareup of her migraine again, 3 times a week, lasting few hours, she been taking BC powder which helps her headaches, she tried triptan in the past, which was not helpful, hydrocodone causes rebound headaches  UPDATE April 5th 2016:She is over all doing  well, taking topamax 25mg  4 tab bid, Firiocet prn, Tizandidine prn, she has tried multiple treatment in the past, does not work for her, She used to only have 2-3 migraines each months, but since February 2016, she is having 2 to 3 headaches each week,  In addition, she recently complains of frequent awakening from nightmare, terrified, about every other day, she reported a history of childhood night terror often followed by a morning severe migraine headaches UPDATE 04/11/2015 Stephanie Finley, 65 year old female returns for follow-up. She has history of migraines currently having about 2-3 headaches per month down from 2-3 a week when last seen by Dr. Terrace ArabiaYan. She recently moved into her own space. She was living with her brother-in-law and sister. She feels that has a lot to do with her decreased stress and decrease in migraines. She also takes nortriptyline at bedtime. She hasn't complained of episodes of night terror bad dreams which occurred frequently however that is infrequent Now. EEG performed after her last visit was normal. She returns for reevaluation  UPDATE 10/13/16 Stephanie Finley, 65 year old female returns for follow-up. She has a history of migraines and is now only having about 1-2 migraines per month. She is currently on Topamax and she takes Fioricet acutely. She also has tizanidine to take prn. She is recovering from bronchitis which she has had for about 6 weeks. She stopped smoking and was congratulated for that. She returns for reevaluation    REVIEW OF SYSTEMS: Full 14 system review of systems performed and notable only for those listed, all others are neg:  Constitutional: neg  Cardiovascular: neg Ear/Nose/Throat: neg  Skin: neg  Eyes: neg Respiratory: Cough and wheezing, recent bout of bronchitis Gastroitestinal: neg  Hematology/Lymphatic: neg  Endocrine: neg Musculoskeletal:neg Allergy/Immunology: Food allergies Neurological: Migraine headaches Psychiatric: neg Sleep :  neg   ALLERGIES: Allergies  Allergen Reactions  . Penicillins     HOME MEDICATIONS: Outpatient Prescriptions Prior to Visit  Medication Sig Dispense Refill  . butalbital-acetaminophen-caffeine (FIORICET, ESGIC) 50-325-40 MG per tablet TAKE 1 TABLET BY MOUTH EVERY 6 HOURS AS NEEDED FOR HEADACHE 60 tablet 5  . SYMBICORT 160-4.5 MCG/ACT inhaler Inhale 1 puff into the lungs as needed.     Marland Kitchen tiZANidine (ZANAFLEX) 4 MG tablet Take 0.5 tablets (2 mg total) by mouth every 8 (eight) hours as needed. 60 tablet 6  . topiramate (TOPAMAX) 25 MG tablet Take 4 tablets (100 mg total) by mouth 2 (two) times daily. (Patient taking differently: Take 100 mg by mouth daily. ) 240 tablet 6  . VAGIFEM 10 MCG TABS vaginal tablet Place 10 mcg vaginally. Twice weekly    . FLUoxetine (PROZAC) 20 MG capsule Take 20 mg by mouth daily. Reported on 10/14/2015  2  . nortriptyline (PAMELOR) 10 MG capsule One po qhs xone week, then 2 tabs po qhs (Patient not taking: Reported on 10/14/2015) 60 capsule 11   No facility-administered medications prior to visit.    PAST MEDICAL HISTORY: Past Medical History  Diagnosis Date  . Headache   . Fibromyalgia   . Osteopenia   . Fibromyalgia     PAST SURGICAL HISTORY: Past Surgical History  Procedure Laterality Date  . Cyst excision      1986    FAMILY HISTORY: Family History  Problem Relation Age of Onset  . Cancer Mother   . Cancer Father     SOCIAL HISTORY: Social History   Social History  . Marital Status: Divorced    Spouse Name: N/A  . Number of Children: 0  . Years of Education: 12+   Occupational History  . Retired    Social History Main Topics  . Smoking status: Former Smoker -- 1.00 packs/day    Types: Cigarettes    Quit date: 09/24/2015  . Smokeless tobacco: Never Used  . Alcohol Use: No  . Drug Use: No  . Sexual Activity: Not on file   Other Topics Concern  . Not on file   Social History Narrative   Patient lives at home alone w/cat.    Patient is retired and has her B.S.    Caffeine - two cups daily.   Patient is divorced.    Patient has a college education.    Patient has no children.            PHYSICAL EXAM  Filed Vitals:   10/14/15 1310  BP: 168/92  Pulse: 81  Height: 5\' 6"  (1.676 m)  Weight: 213 lb 9.6 oz (96.888 kg)   Body mass index is 34.49 kg/(m^2). Generalized: Well developed, in no acute distress  Head: normocephalic and atraumatic,. Oropharynx benign  Neck: Supple, no carotid bruits  Musculoskeletal: No deformity   Neurological examination   Mentation: Alert oriented to time, place, history taking. Attention span and concentration appropriate. Recent and remote memory intact. Follows all commands speech and language fluent.   Cranial nerve II-XII: .Pupils were equal round reactive to light extraocular movements were full, visual field were full on confrontational test. Facial sensation and strength were normal. hearing was intact to finger rubbing bilaterally. Uvula tongue midline. head turning and shoulder shrug were normal and symmetric.Tongue protrusion  into cheek strength was normal. Motor: normal bulk and tone, full strength in the BUE, BLE, fine finger movements normal, no pronator drift. No focal weakness Coordination: finger-nose-finger, heel-to-shin bilaterally, no dysmetria Reflexes: Brachioradialis 2/2, biceps 2/2, triceps 2/2, patellar 2/2, Achilles 2/2, plantar responses were flexor bilaterally. Gait and Station: Rising up from seated position without assistance, normal stance, moderate stride, good arm swing, smooth turning, able to perform tiptoe, and heel walking without difficulty. Tandem gait is steady DIAGNOSTIC DATA (LABS, IMAGING, TESTING) - ASSESSMENT AND PLAN 65 y.o. year old female has a past medical history of Headache; and night terror which is much better. She is having 2- headaches a month.  PLAN Continue Topamax at current dose will refill Continue Fioricet  when necessary as needed acute headache will refill Continue tizanidine when necessary Follow-up in 8 months next with Dr. Carmelina Noun, [facility], [facility], APRN  [facility] [address] [place], [place] [address] [phone]

## 2015-10-14 NOTE — Patient Instructions (Signed)
Continue Topamax at current dose will refill Continue Fioricet when necessary as needed acute headache will refill Continue tizanidine when necessary Follow-up in 8 months next with Dr. Terrace ArabiaYan

## 2016-06-15 ENCOUNTER — Ambulatory Visit (INDEPENDENT_AMBULATORY_CARE_PROVIDER_SITE_OTHER): Payer: Medicare Other | Admitting: Neurology

## 2016-06-15 ENCOUNTER — Encounter: Payer: Self-pay | Admitting: Neurology

## 2016-06-15 VITALS — BP 129/90 | HR 81 | Ht 66.0 in | Wt 246.0 lb

## 2016-06-15 DIAGNOSIS — G43909 Migraine, unspecified, not intractable, without status migrainosus: Secondary | ICD-10-CM

## 2016-06-15 MED ORDER — TOPIRAMATE 25 MG PO TABS: 50.0000 mg | ORAL_TABLET | Freq: Two times a day (BID) | ORAL | 4 refills | Status: DC

## 2016-06-15 MED ORDER — TIZANIDINE HCL 4 MG PO TABS: 2.0000 mg | ORAL_TABLET | Freq: Three times a day (TID) | ORAL | 11 refills | Status: DC | PRN

## 2016-06-15 NOTE — Progress Notes (Signed)
[facility]  PATIENT: Stephanie Finley DOB: 1951/05/07   REASON FOR VISIT: Follow-up for migraine HISTORY FROM: Patient    HISTORY OF PRESENT ILLNESS: HISTORY: follow up of chronic migraine headaches.  She reported history of migraine since she was a kid, increased frequency over the past few years, she moved from [place] to [place], was under the care of [facility] Dr. Catalina Lunger until fall of 2012.   Patient reported increased frequency of migraine since 2012, couple times a week, lasting one day to one and half days, severe right retro-orbital area pounding headache, with associated light noise sensitivity, nausea, occasionally right side face, arm, leg numbness and weakness, to the point of limp, drop things out of her hand   She is currently taking topiramate 100 mg every day since 2007, does help her headaches, also takes tizanidine 4 mg one and half tablets to 4 tablets q.d. as needed for migraine, she is also taking a combination of Isometho/dichlor/apa, 65,/100/325 mg as needed for abortive treatment, she also takes baby aspirin, BC Porter, "whatever I can get my hands on", couple times a day for her frequent headaches.   She has tried and failed different medications in the past, propranolol, different beta blocker, antidepression,  she reported bad reaction to multiple triptan treatment in the past, including jaw and neck stiffness, swelling  She complains of upset stomach with her multiple medications now, she also complains of excessive stress at home,   UPDATE Sep 19th 2017: She had a right eye vitreous bleeding at the end of May 2017, was under close supervision of her ophthalmologist every 2 weeks, the bright light has triggered some of headache headache, she reported 2 major migraine headaches in August 2017, she is taking Fioricet, tizanadine as needed, which has been helpful, her typical migraine are right retro-orbital area severe  pounding headache, radiating towards her right jaw, right shoulder, with associated right noise sensitivity,  She is also taking Topamax 25 mg 2 tablets twice a day as migraine prevention, tolerating it well  REVIEW OF SYSTEMS: Full 14 system review of systems performed and notable only for those listed, all others are neg:  Depression, anxiety, frequent awakening, neck stiffness, food allergy, constipation, cough, appetite change  ALLERGIES: Allergies  Allergen Reactions  . Penicillins Hives    HOME MEDICATIONS: Outpatient Medications Prior to Visit  Medication Sig Dispense Refill  . butalbital-acetaminophen-caffeine (FIORICET, ESGIC) 50-325-40 MG tablet TAKE 1 TABLET BY MOUTH EVERY 6 HOURS AS NEEDED FOR HEADACHE 60 tablet 5  . calcium carbonate 1250 MG capsule Take 1,250 mg by mouth daily. With Vit D    . cyanocobalamin 1000 MCG tablet Take 100 mcg by mouth daily.    . SYMBICORT 160-4.5 MCG/ACT inhaler Inhale 1 puff into the lungs as needed.     Marland Kitchen tiZANidine (ZANAFLEX) 4 MG tablet Take 0.5 tablets (2 mg total) by mouth every 8 (eight) hours as needed. 60 tablet 6  . topiramate (TOPAMAX) 25 MG tablet Take 4 tablets (100 mg total) by mouth 2 (two) times daily. 240 tablet 8  . VAGIFEM 10 MCG TABS vaginal tablet Place 10 mcg vaginally. Twice weekly    . FLUoxetine (PROZAC) 40 MG capsule TK 1 C PO QD IN THE MORNING  0   No facility-administered medications prior to visit.     PAST MEDICAL HISTORY: Past Medical History:  Diagnosis Date  . Fibromyalgia   . Fibromyalgia   . Headache   . Osteopenia  PAST SURGICAL HISTORY: Past Surgical History:  Procedure Laterality Date  . CYST EXCISION     1986    FAMILY HISTORY: Family History  Problem Relation Age of Onset  . Cancer Mother   . Cancer Father     SOCIAL HISTORY: Social History   Social History  . Marital status: Divorced    Spouse name: N/A  . Number of children: 0  . Years of education: 12+   Occupational  History  . Retired    Social History Main Topics  . Smoking status: Former Smoker    Packs/day: 1.00    Types: Cigarettes    Quit date: 09/24/2015  . Smokeless tobacco: Never Used  . Alcohol use No  . Drug use: No  . Sexual activity: Not on file   Other Topics Concern  . Not on file   Social History Narrative   Patient lives at home alone w/cat.   Patient is retired and has her B.S.    Caffeine - two cups daily.   Patient is divorced.    Patient has a college education.    Patient has no children.            PHYSICAL EXAM  Vitals:   06/15/16 1431  BP: 129/90  Pulse: 81  Weight: 246 lb (111.6 kg)  Height: 5\' 6"  (1.676 m)   Body mass index is 39.71 kg/m. Generalized: Well developed, in no acute distress  Head: normocephalic and atraumatic,. Oropharynx benign  Neck: Supple, no carotid bruits  Musculoskeletal: No deformity   Neurological examination   Mentation: Alert oriented to time, place, history taking. Attention span and concentration appropriate. Recent and remote memory intact. Follows all commands speech and language fluent.   Cranial nerve II-XII: .Pupils were equal round reactive to light extraocular movements were full, visual field were full on confrontational test. Facial sensation and strength were normal. hearing was intact to finger rubbing bilaterally. Uvula tongue midline. head turning and shoulder shrug were normal and symmetric.Tongue protrusion into cheek strength was normal. Motor: normal bulk and tone, full strength in the BUE, BLE, fine finger movements normal, no pronator drift. No focal weakness Coordination: finger-nose-finger, heel-to-shin bilaterally, no dysmetria Reflexes: Brachioradialis 2/2, biceps 2/2, triceps 2/2, patellar 2/2, Achilles 2/2, plantar responses were flexor bilaterally. Gait and Station: Rising up from seated position without assistance, normal stance, moderate stride, good arm swing, smooth turning, able to perform  tiptoe, and heel walking without difficulty. Tandem gait is steady DIAGNOSTIC DATA (LABS, IMAGING, TESTING) - ASSESSMENT AND PLAN 65 y.o. year old female  Chronic migraine headache  Continue Topamax 25 mg 2 tablets twice a day new prescription was written   Fioricet as needed  Return to clinic for new issues  Levert FeinsteinYijun Aurelius Gildersleeve, M.D. Ph.D.  [facility] [address] [place], [place] [address] Phone: [phone] Fax:      [phone]

## 2016-10-29 ENCOUNTER — Telehealth (HOSPITAL_COMMUNITY): Payer: Self-pay | Admitting: Family Medicine

## 2016-10-30 ENCOUNTER — Other Ambulatory Visit: Payer: Self-pay | Admitting: Family Medicine

## 2016-10-30 DIAGNOSIS — R609 Edema, unspecified: Secondary | ICD-10-CM

## 2016-10-30 DIAGNOSIS — R0602 Shortness of breath: Secondary | ICD-10-CM

## 2016-11-06 NOTE — Telephone Encounter (Signed)
10/29/2016 03:07 PM Phone (Outgoing) [place] PedMcIntosh, Stephanie (Self) [phone] (H)   Left Message - Called pt and lmsg for her to get scheduled for an echo per Dr. Shaune Pollackonna Gates. Please schedule if/when pt calls back.    By Elita Booneegina A Braxon Suder

## 2016-11-12 ENCOUNTER — Other Ambulatory Visit: Payer: Self-pay

## 2016-11-12 ENCOUNTER — Ambulatory Visit (HOSPITAL_COMMUNITY): Payer: Medicare Other | Attending: Cardiovascular Disease

## 2016-11-12 DIAGNOSIS — R0602 Shortness of breath: Secondary | ICD-10-CM | POA: Diagnosis not present

## 2016-11-12 DIAGNOSIS — R609 Edema, unspecified: Secondary | ICD-10-CM | POA: Diagnosis not present

## 2016-11-25 ENCOUNTER — Other Ambulatory Visit: Payer: Self-pay | Admitting: Nurse Practitioner

## 2016-12-01 ENCOUNTER — Other Ambulatory Visit: Payer: Self-pay

## 2016-12-01 MED ORDER — BUTALBITAL-APAP-CAFFEINE 50-325-40 MG PO TABS: ORAL_TABLET | ORAL | 5 refills | Status: DC

## 2017-07-27 ENCOUNTER — Other Ambulatory Visit: Payer: Self-pay | Admitting: Neurology

## 2017-07-28 ENCOUNTER — Other Ambulatory Visit: Payer: Self-pay | Admitting: *Deleted

## 2017-07-28 ENCOUNTER — Other Ambulatory Visit: Payer: Self-pay | Admitting: Neurology

## 2017-10-21 ENCOUNTER — Other Ambulatory Visit: Payer: Self-pay | Admitting: Acute Care

## 2017-10-21 DIAGNOSIS — Z87891 Personal history of nicotine dependence: Secondary | ICD-10-CM

## 2017-10-21 DIAGNOSIS — Z122 Encounter for screening for malignant neoplasm of respiratory organs: Secondary | ICD-10-CM

## 2017-11-05 ENCOUNTER — Ambulatory Visit (INDEPENDENT_AMBULATORY_CARE_PROVIDER_SITE_OTHER)
Admission: RE | Admit: 2017-11-05 | Discharge: 2017-11-05 | Disposition: A | Discharge status: Home / Self Care | Payer: Medicare Other | Source: Ambulatory Visit | Attending: Acute Care | Admitting: Acute Care

## 2017-11-05 ENCOUNTER — Ambulatory Visit (INDEPENDENT_AMBULATORY_CARE_PROVIDER_SITE_OTHER): Payer: Medicare Other | Admitting: Acute Care

## 2017-11-05 ENCOUNTER — Encounter: Payer: Self-pay | Admitting: Acute Care

## 2017-11-05 DIAGNOSIS — Z87891 Personal history of nicotine dependence: Secondary | ICD-10-CM | POA: Diagnosis not present

## 2017-11-05 DIAGNOSIS — Z122 Encounter for screening for malignant neoplasm of respiratory organs: Secondary | ICD-10-CM

## 2017-11-05 NOTE — Progress Notes (Signed)
Shared Decision Making Visit Lung Cancer Screening Program [phone]([id number])   Eligibility:  Age 67 y.o.  Pack Years Smoking History Calculation 44 pack year smoking history (# packs/per year x # years smoked)  Recent History of coughing up blood  no  Unexplained weight loss? no ( >Than 15 pounds within the last 6 months )  Prior History Lung / other cancer no (Diagnosis within the last 5 years already requiring surveillance chest CT Scans).  Smoking Status Former Smoker  Former Smokers: Years since quit: 2 years  Quit Date: 08/2015  Visit Components:  Discussion included one or more decision making aids. yes  Discussion included risk/benefits of screening. yes  Discussion included potential follow up diagnostic testing for abnormal scans. yes  Discussion included meaning and risk of over diagnosis. yes  Discussion included meaning and risk of False Positives. yes  Discussion included meaning of total radiation exposure. yes  Counseling Included:  Importance of adherence to annual lung cancer LDCT screening. yes  Impact of comorbidities on ability to participate in the program. yes  Ability and willingness to under diagnostic treatment. yes  Smoking Cessation Counseling:  Current Smokers:   Discussed importance of smoking cessation. yes  Information about tobacco cessation classes and interventions provided to patient. yes  Patient provided with "ticket" for LDCT Scan. yes  Symptomatic Patient. no  Counseling  Diagnosis Code: Tobacco Use Z72.0  Asymptomatic Patient yes  Counseling (Intermediate counseling: > three minutes counseling) [id number]  Former Smokers:   Discussed the importance of maintaining cigarette abstinence. yes  Diagnosis Code: Personal History of Nicotine Dependence. O53.664Z87.891  Information about tobacco cessation classes and interventions provided to patient. Yes  Patient provided with "ticket" for LDCT Scan. yes  Written Order for Lung  Cancer Screening with LDCT placed in Epic. Yes (CT Chest Lung Cancer Screening Low Dose W/O CM) [id number] Z12.2-Screening of respiratory organs Z87.891-Personal history of nicotine dependence  I spent 25 minutes of face to face time with Ms. Glenetta HewMcIntosh discussing the risks and benefits of lung cancer screening. We viewed a power point together that explained in detail the above noted topics. We took the time to pause the power point at intervals to allow for questions to be asked and answered to ensure understanding. We discussed that she had taken the single most powerful action possible to decrease her risk of developing lung cancer when she quit smoking. I counseled her to remain smoke free, and to contact me if she ever had the desire to smoke again so that I can provide resources and tools to help support the effort to remain smoke free. We discussed the time and location of the scan, and that either  Abigail Miyamotoenise Phelps RN or I will call with the results within  24-48 hours of receiving them. She has my card and contact information in the event she needs to speak with me, in addition to a copy of the power point we reviewed as a resource. She verbalized understanding of all of the above and had no further questions upon leaving the office.     I explained to the patient that there has been a high incidence of coronary artery disease noted on these exams. I explained that this is a non-gated exam therefore degree or severity cannot be determined. This patient is not on statin therapy. I have asked the patient to follow-up with their PCP regarding any incidental finding of coronary artery disease and management with diet or medication as they feel is clinically  indicated. The patient verbalized understanding of the above and had no further questions.     Bevelyn Ngo, NP 11/05/2017

## 2017-11-08 ENCOUNTER — Other Ambulatory Visit: Payer: Self-pay | Admitting: Acute Care

## 2017-11-08 DIAGNOSIS — Z87891 Personal history of nicotine dependence: Secondary | ICD-10-CM

## 2017-11-08 DIAGNOSIS — Z122 Encounter for screening for malignant neoplasm of respiratory organs: Secondary | ICD-10-CM

## 2017-12-25 ENCOUNTER — Other Ambulatory Visit: Payer: Self-pay | Admitting: Neurology

## 2018-03-15 ENCOUNTER — Other Ambulatory Visit: Payer: Self-pay | Admitting: Family Medicine

## 2018-03-15 DIAGNOSIS — Z1231 Encounter for screening mammogram for malignant neoplasm of breast: Secondary | ICD-10-CM

## 2018-04-04 ENCOUNTER — Ambulatory Visit
Admission: RE | Admit: 2018-04-04 | Discharge: 2018-04-04 | Disposition: A | Discharge status: Home / Self Care | Payer: Medicare Other | Source: Ambulatory Visit | Attending: Family Medicine | Admitting: Family Medicine

## 2018-04-04 DIAGNOSIS — Z1231 Encounter for screening mammogram for malignant neoplasm of breast: Secondary | ICD-10-CM

## 2018-10-11 IMAGING — CT CT CHEST LUNG CANCER SCREENING LOW DOSE W/O CM
2 of 4 series · 15 of 40 positions shown, 18 images · non-contrast
Comparison: None.

CLINICAL DATA: 66-year-old female former smoker, quit 2 years ago,
with 44 pack-year history of smoking, for initial lung cancer
screening

EXAM:
CT CHEST WITHOUT CONTRAST LOW-DOSE FOR LUNG CANCER SCREENING
TECHNIQUE: Multidetector CT imaging of the chest was performed following the
standard protocol without IV contrast.

[Series 2: thorax 5.0 i31f 3 · axial · 0.76mm/px · z∈[-336,-86]mm · 12 of 60 slices shown, 15 images]
[im 5/60  mediastinal]
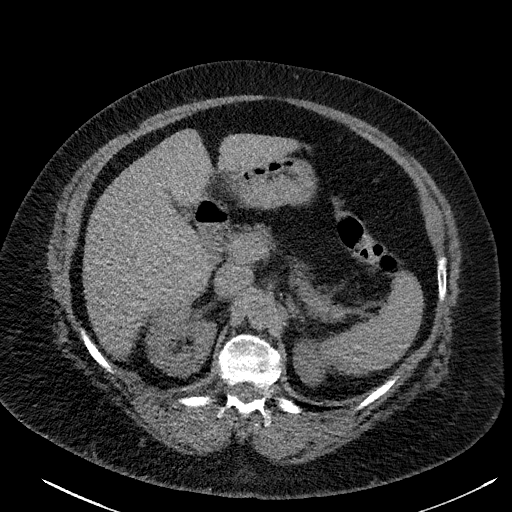
[im 5/60  lung]
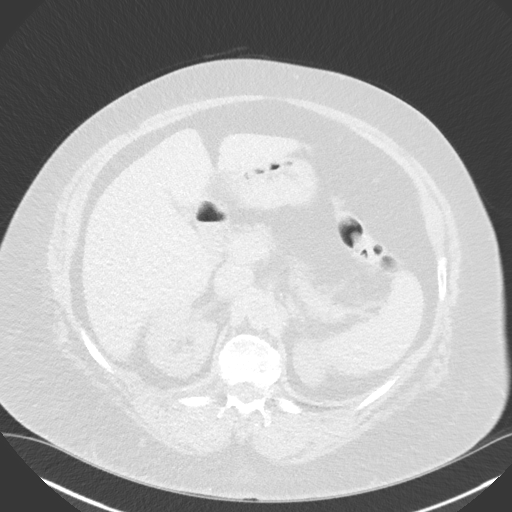
[im 10/60  lung]
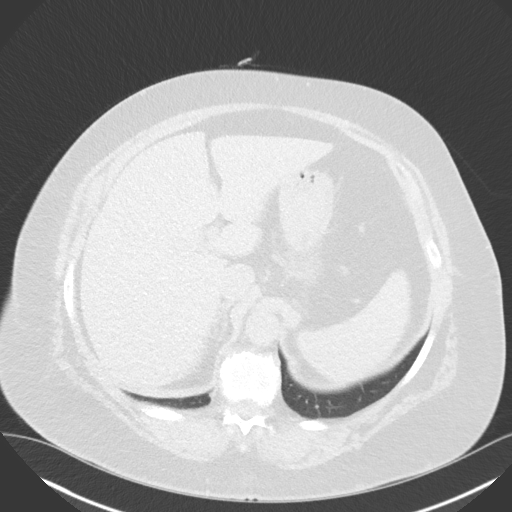
[im 14/60  lung]
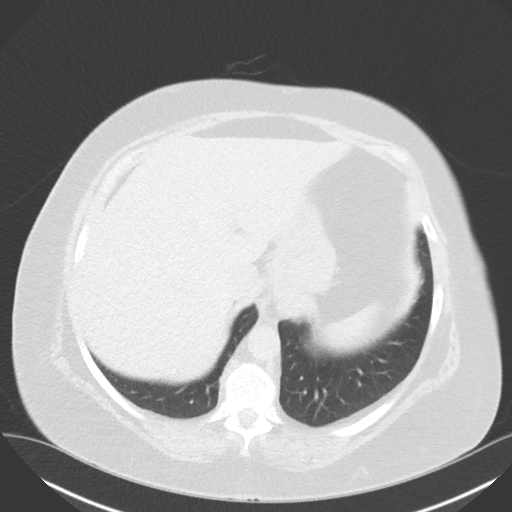
[im 19/60  lung]
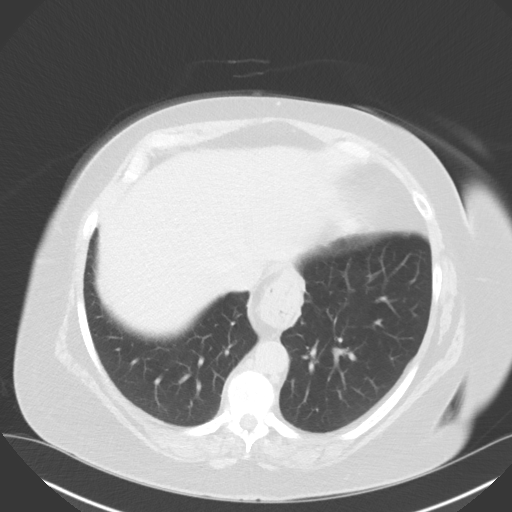
[im 23/60  mediastinal]
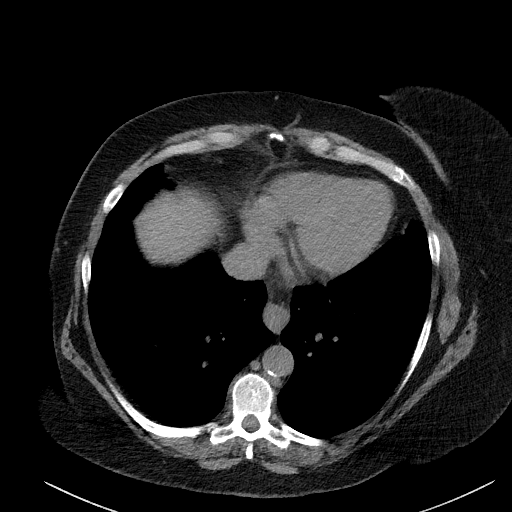
[im 23/60  lung]
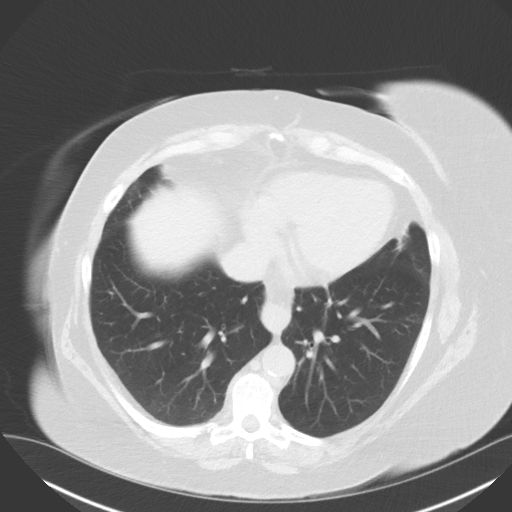
[im 28/60  lung]
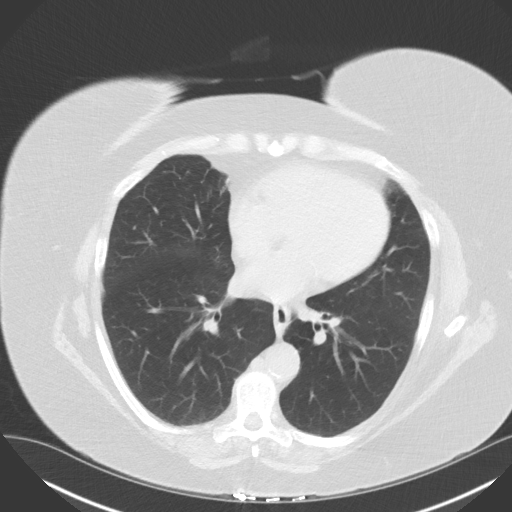
[im 32/60  lung]
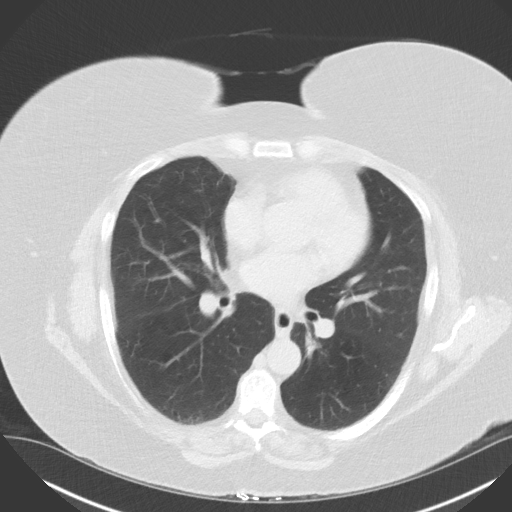
[im 37/60  lung]
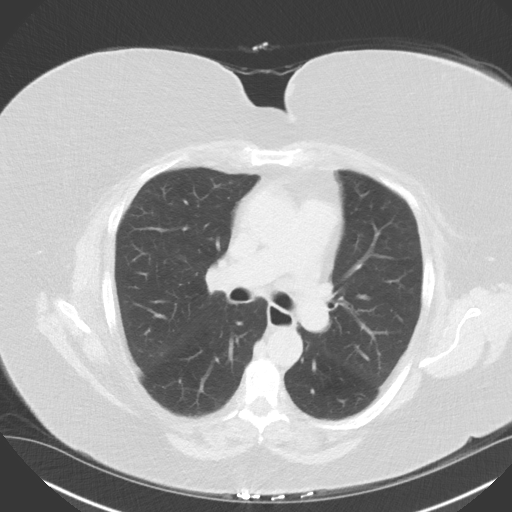
[im 41/60  mediastinal]
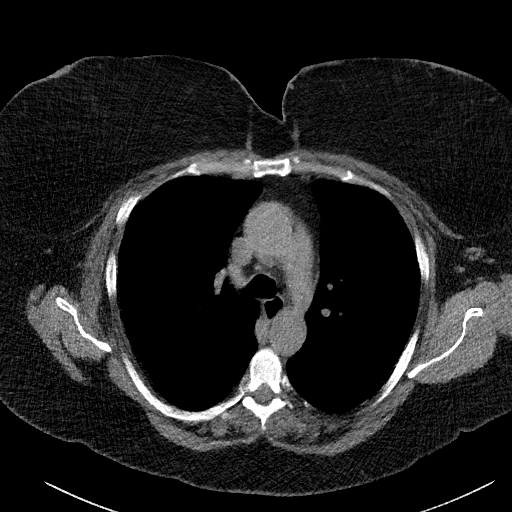
[im 41/60  lung]
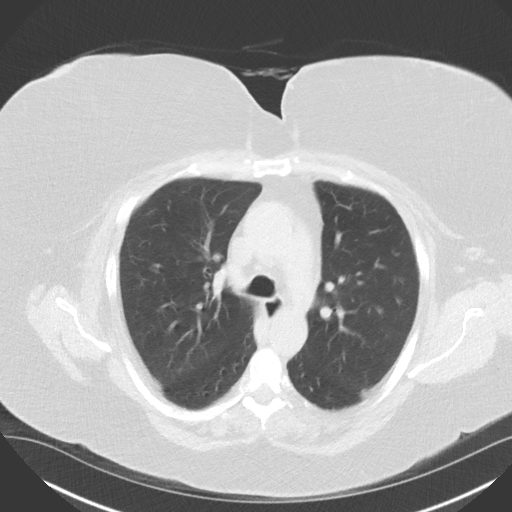
[im 46/60  lung]
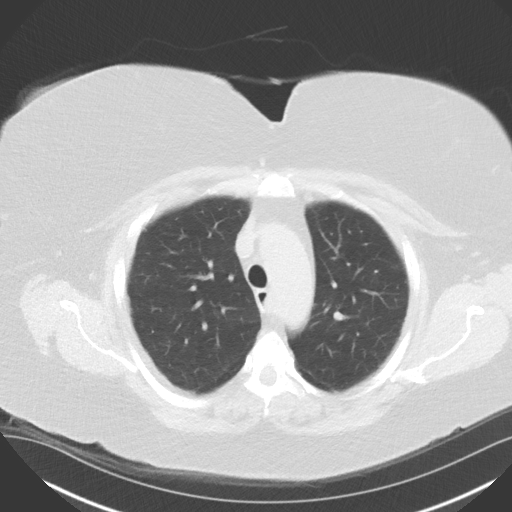
[im 50/60  lung]
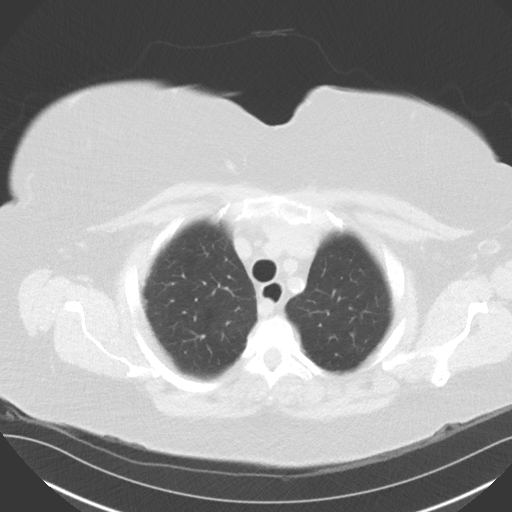
[im 55/60  lung]
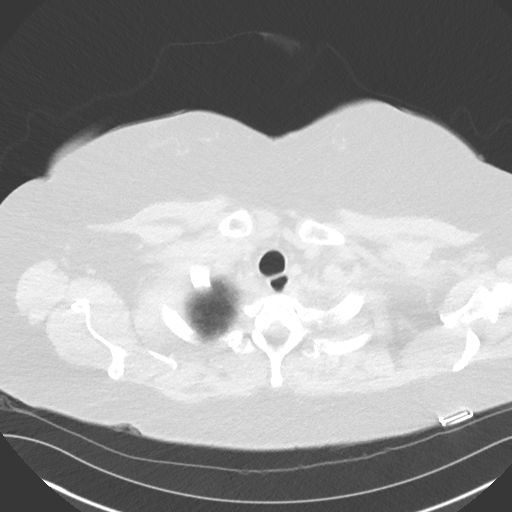

[Series 5: coronal · coronal · 0.59mm/px · 3 of 131 slices shown]
[im 27/131  lung]
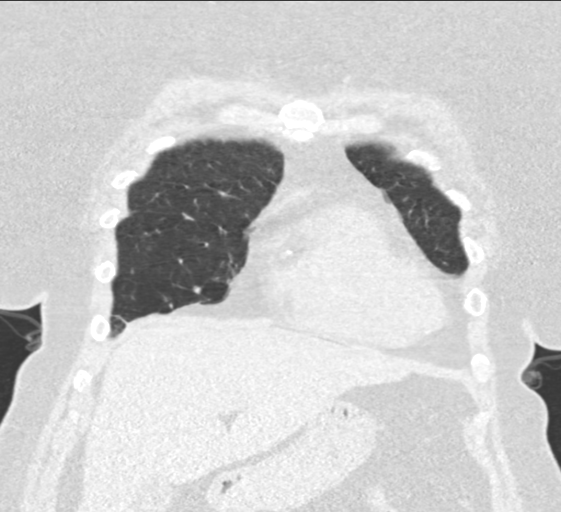
[im 53/131  lung]
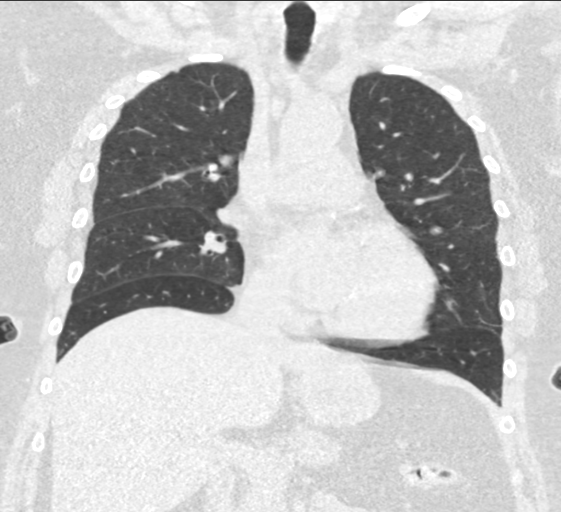
[im 79/131  lung]
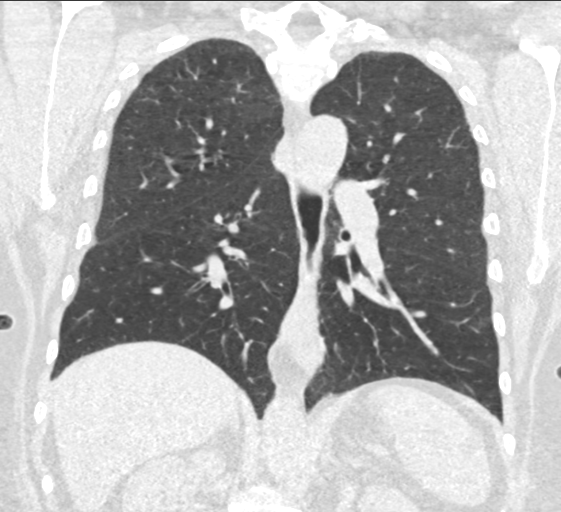

[15 of 40 positions shown; findings below may reference images not displayed]

FINDINGS: Cardiovascular: The heart is normal in size. No pericardial
effusion.

No evidence of thoracic aortic aneurysm. Very mild atherosclerotic
calcifications of the aortic arch.

Mild three-vessel coronary atherosclerosis.

Mediastinum/Nodes: No suspicious mediastinal lymphadenopathy.

Visualized thyroid is unremarkable.

Lungs/Pleura: Evaluation of the lung parenchyma is constrained by
respiratory motion. Within that constraint, there are no suspicious
pulmonary nodules.

Mild centrilobular and paraseptal emphysematous changes, upper lobe
predominant.

No focal consolidation.

Mild scarring/atelectasis in the lingula. Mild linear scarring in
the right middle lobe and left lower lobe.

No pleural effusion or pneumothorax.

Upper Abdomen: Visualized upper abdomen is notable for a small
hiatal hernia and a 4 mm nonobstructing right renal calculus (series
2/image 60).

Musculoskeletal: Mild degenerative changes of the lower thoracic
spine.
IMPRESSION: Lung-RADS 1, negative. Continue annual screening with low-dose chest
CT without contrast in 12 months.

Aortic Atherosclerosis ([id number].W).

## 2019-07-26 ENCOUNTER — Encounter

## 2019-07-26 ENCOUNTER — Encounter: Payer: Self-pay | Admitting: Neurology

## 2019-07-26 ENCOUNTER — Other Ambulatory Visit: Payer: Self-pay

## 2019-07-26 ENCOUNTER — Ambulatory Visit: Payer: Medicare Other | Admitting: Neurology

## 2019-07-26 VITALS — BP 135/85 | HR 90 | Temp 97.5°F | Ht 66.0 in | Wt 243.5 lb

## 2019-07-26 DIAGNOSIS — G43709 Chronic migraine without aura, not intractable, without status migrainosus: Secondary | ICD-10-CM | POA: Diagnosis not present

## 2019-07-26 DIAGNOSIS — IMO0002 Reserved for concepts with insufficient information to code with codable children: Secondary | ICD-10-CM

## 2019-07-26 MED ORDER — TIZANIDINE HCL 4 MG PO TABS: 4.0000 mg | ORAL_TABLET | Freq: Four times a day (QID) | ORAL | 6 refills | Status: DC | PRN

## 2019-07-26 MED ORDER — LAMOTRIGINE 100 MG PO TABS: 100.0000 mg | ORAL_TABLET | Freq: Two times a day (BID) | ORAL | 11 refills | Status: DC

## 2019-07-26 MED ORDER — LAMOTRIGINE 25 MG PO TABS: ORAL_TABLET | ORAL | 0 refills | Status: DC

## 2019-07-26 MED ORDER — BUTALBITAL-APAP-CAFFEINE 50-325-40 MG PO TABS: 1.0000 | ORAL_TABLET | Freq: Four times a day (QID) | ORAL | 5 refills | Status: DC | PRN

## 2019-07-26 MED ORDER — ONDANSETRON 4 MG PO TBDP: 4.0000 mg | ORAL_TABLET | Freq: Three times a day (TID) | ORAL | 6 refills | Status: DC | PRN

## 2019-07-26 NOTE — Progress Notes (Signed)
PATIENT: Stephanie Finley DOB: 22-May-1951  Chief Complaint  Patient presents with  . Migraine    Last seen 06/15/2016.  She has returned due to increased migraine frequency.  She estimates ten per month.  She is using leftover Fioricet for pain.  She was previously on Topamax.  Reports a possible kidney stone passing two years ago but this was not confirmed.   Marland Kitchen PCP    Glenis Smoker, MD     HISTORICAL  Stephanie Finley is a 68 year old female, seen in request by her primary care physician Dr. Sela Hilding for evaluation of chronic migraine, initial evaluation was on July 26, 2019.  I have reviewed and summarized the referring note from the referring physician.  She had past medical history of chronic migraine since childhood, her typical migraine lateralized retro-orbital area severe pounding headache with associated light, noise sensitivity, nauseous, lasting for few hours to 1 day, she used to have migraines about couple times each month, but since May 2020, she began to have increased frequency of migraine, 2-3 times each week, sometimes her migraine preceded by loss of vision in the right visual field,  Previously she took Topamax as migraine prevention, which does help her migraine, she has a questionable history of kidney stone, she also complained side effect of Topamax weight gain, feeling fatigued,  For abortive treatment, previously she has tried different triptans, Imitrex, Maxalt, does not help her headache, she is not taking Fioricet as needed, which is helpful.   REVIEW OF SYSTEMS: Full 14 system review of systems performed and notable only for as above All other review of systems were negative.  ALLERGIES: Allergies  Allergen Reactions  . Penicillins Hives    HOME MEDICATIONS: Current Outpatient Medications  Medication Sig Dispense Refill  . albuterol (VENTOLIN HFA) 108 (90 Base) MCG/ACT inhaler Inhale 2 puffs into the lungs every 4 (four) hours as  needed.     . butalbital-acetaminophen-caffeine (FIORICET, ESGIC) 50-325-40 MG tablet TAKE 1 TABLET BY MOUTH EVERY 6 HOURS AS NEEDED FOR HEADACHE.  Please call [phone] to schedule appt for continue refills. 60 tablet 0  . calcium carbonate (TUMS - DOSED IN MG ELEMENTAL CALCIUM) 500 MG chewable tablet Chew 1 tablet by mouth as needed for indigestion or heartburn.    Marland Kitchen FLUoxetine (PROZAC) 20 MG capsule Take 20 mg by mouth daily.    . fluticasone (FLONASE) 50 MCG/ACT nasal spray Place 2 sprays into both nostrils as needed.     Marland Kitchen omeprazole (PRILOSEC) 10 MG capsule Take 10 mg by mouth daily.    . SYMBICORT 160-4.5 MCG/ACT inhaler Inhale 2 puffs into the lungs daily.      No current facility-administered medications for this visit.     PAST MEDICAL HISTORY: Past Medical History:  Diagnosis Date  . Fibromyalgia   . Fibromyalgia   . Headache   . Migraines   . Osteopenia     PAST SURGICAL HISTORY: Past Surgical History:  Procedure Laterality Date  . CYST EXCISION     1986  . LAPAROTOMY     x 3    FAMILY HISTORY: Family History  Problem Relation Age of Onset  . Ovarian cancer Mother   . Brain cancer Father     SOCIAL HISTORY: Social History   Socioeconomic History  . Marital status: Divorced    Spouse name: Not on file  . Number of children: 0  . Years of education: college  . Highest education level: Bachelor's degree (e.g., BA,  AB, BS)  Occupational History  . Occupation: Retired  Engineer, production  . Financial resource strain: Not on file  . Food insecurity    Worry: Not on file    Inability: Not on file  . Transportation needs    Medical: Not on file    Non-medical: Not on file  Tobacco Use  . Smoking status: Former Smoker    Packs/day: 1.00    Years: 44.00    Pack years: 44.00    Types: Cigarettes    Quit date: 09/24/2015    Years since quitting: 3.8  . Smokeless tobacco: Never Used  Substance and Sexual Activity  . Alcohol use: No    Alcohol/week: 0.0  standard drinks  . Drug use: No  . Sexual activity: Not on file  Lifestyle  . Physical activity    Days per week: Not on file    Minutes per session: Not on file  . Stress: Not on file  Relationships  . Social Musician on phone: Not on file    Gets together: Not on file    Attends religious service: Not on file    Active member of club or organization: Not on file    Attends meetings of clubs or organizations: Not on file    Relationship status: Not on file  . Intimate partner violence    Fear of current or ex partner: Not on file    Emotionally abused: Not on file    Physically abused: Not on file    Forced sexual activity: Not on file  Other Topics Concern  . Not on file  Social History Narrative   Patient lives at home alone w/cat.   Patient is retired and has her B.S.    Caffeine - two cups daily.   Patient is divorced.    Patient has a college education.    Patient has no children.         PHYSICAL EXAM   Vitals:   07/26/19 0823  Temp: (!) 97.5 F (36.4 C)  Weight: 243 lb 8 oz (110.5 kg)  Height: 5\' 6"  (1.676 m)    Not recorded      Body mass index is 39.3 kg/m.  PHYSICAL EXAMNIATION:  Gen: NAD, conversant, well nourised, well groomed                     Cardiovascular: Regular rate rhythm, no peripheral edema, warm, nontender. Eyes: Conjunctivae clear without exudates or hemorrhage Neck: Supple, no carotid bruits. Pulmonary: Clear to auscultation bilaterally   NEUROLOGICAL EXAM:  MENTAL STATUS: Speech:    Speech is normal; fluent and spontaneous with normal comprehension.  Cognition:     Orientation to time, place and person     Normal recent and remote memory     Normal Attention span and concentration     Normal Language, naming, repeating,spontaneous speech     Fund of knowledge   CRANIAL NERVES: CN II: Visual fields are full to confrontation. Pupils are round equal and briskly reactive to light. CN III, IV, VI: extraocular  movement are normal. No ptosis. CN V: Facial sensation is intact to pinprick in all 3 divisions bilaterally. Corneal responses are intact.  CN VII: Face is symmetric with normal eye closure and smile. CN VIII: Hearing is normal to causal conversation. CN IX, X: Palate elevates symmetrically. Phonation is normal. CN XI: Head turning and shoulder shrug are intact CN XII: Tongue is midline with normal  movements and no atrophy.  MOTOR: There is no pronator drift of out-stretched arms. Muscle bulk and tone are normal. Muscle strength is normal.  REFLEXES: Reflexes are 2+ and symmetric at the biceps, triceps, knees, and ankles. Plantar responses are flexor.  SENSORY: Intact to light touch, pinprick, positional sensation and vibratory sensation are intact in fingers and toes.  COORDINATION: Rapid alternating movements and fine finger movements are intact. There is no dysmetria on finger-to-nose and heel-knee-shin.    GAIT/STANCE: Posture is normal. Gait is steady with normal steps, base, arm swing, and turning. Heel and toe walking are normal. Tandem gait is normal.  Romberg is absent.   DIAGNOSTIC DATA (LABS, IMAGING, TESTING) - I reviewed patient records, labs, notes, testing and imaging myself where available.   ASSESSMENT AND PLAN  Arbutus PedMary Moxon is a 68 y.o. female   Chronic migraine headaches  Start preventive medication lamotrigine, titrating to 100 mg twice a day  Fioricet as needed, may combine it with Zofran, tizanidine, even Aleve for abortive treatment   Levert FeinsteinYijun Lilleigh Hechavarria, M.D. Ph.D.  [facility] [address], [address] [place], [place] [address] Ph: [phone] Fax: [phone]  CC: Shon Haleimberlake, Kathryn S, MD

## 2019-11-02 ENCOUNTER — Ambulatory Visit: Payer: Medicare Other | Admitting: Neurology

## 2020-01-18 ENCOUNTER — Ambulatory Visit: Payer: Medicare Other | Admitting: Neurology

## 2020-01-18 ENCOUNTER — Encounter: Payer: Self-pay | Admitting: Neurology

## 2020-01-18 ENCOUNTER — Other Ambulatory Visit: Payer: Self-pay

## 2020-01-18 VITALS — BP 162/78 | HR 80 | Temp 97.1°F | Ht 66.0 in | Wt 242.8 lb

## 2020-01-18 DIAGNOSIS — IMO0002 Reserved for concepts with insufficient information to code with codable children: Secondary | ICD-10-CM

## 2020-01-18 DIAGNOSIS — G43709 Chronic migraine without aura, not intractable, without status migrainosus: Secondary | ICD-10-CM | POA: Diagnosis not present

## 2020-01-18 MED ORDER — LAMOTRIGINE 100 MG PO TABS: 100.0000 mg | ORAL_TABLET | Freq: Two times a day (BID) | ORAL | 11 refills | Status: DC

## 2020-01-18 NOTE — Progress Notes (Signed)
PATIENT: Stephanie Finley DOB: [birth year]  REASON FOR VISIT: follow up HISTORY FROM: patient  HISTORY OF PRESENT ILLNESS: Today 01/18/20  HISTORY  Stephanie Finley is a 69 year old female, seen in request by her primary care physician Dr. Sela Hilding for evaluation of chronic migraine, initial evaluation was on July 26, 2019.  I have reviewed and summarized the referring note from the referring physician.  She had past medical history of chronic migraine since childhood, her typical migraine lateralized retro-orbital area severe pounding headache with associated light, noise sensitivity, nauseous, lasting for few hours to 1 day, she used to have migraines about couple times each month, but since May 2020, she began to have increased frequency of migraine, 2-3 times each week, sometimes her migraine preceded by loss of vision in the right visual field,  Previously she took Topamax as migraine prevention, which does help her migraine, she has a questionable history of kidney stone, she also complained side effect of Topamax weight gain, feeling fatigued,  For abortive treatment, previously she has tried different triptans, Imitrex, Maxalt, does not help her headache, she is not taking Fioricet as needed, which is helpful.  Update January 18, 2020 SS: When last seen, was started on Lamictal working up to 100 mg twice a day, tolerating well.  Her headaches are doing really well.  On average having 1 migraine a week, this is the " gold standard" for her.  She will take Fioricet with excellent benefit.  She is pleased how well her headaches are doing.  Has history of anxiety, fibromyalgia, IBS, scoliosis.  She presents today unaccompanied.  REVIEW OF SYSTEMS: Out of a complete 14 system review of symptoms, the patient complains only of the following symptoms, and all other reviewed systems are negative.  Headache   ALLERGIES: Allergies  Allergen Reactions  . Penicillins Hives     HOME MEDICATIONS: Outpatient Medications Prior to Visit  Medication Sig Dispense Refill  . albuterol (VENTOLIN HFA) 108 (90 Base) MCG/ACT inhaler Inhale 2 puffs into the lungs every 4 (four) hours as needed.     . butalbital-acetaminophen-caffeine (FIORICET) 50-325-40 MG tablet Take 1 tablet by mouth every 6 (six) hours as needed for headache. 12 tablet 5  . calcium carbonate (TUMS - DOSED IN MG ELEMENTAL CALCIUM) 500 MG chewable tablet Chew 1 tablet by mouth as needed for indigestion or heartburn.    Marland Kitchen FLUoxetine (PROZAC) 20 MG capsule Take 20 mg by mouth daily.    . fluticasone (FLONASE) 50 MCG/ACT nasal spray Place 2 sprays into both nostrils as needed.     Marland Kitchen omeprazole (PRILOSEC) 10 MG capsule Take 10 mg by mouth daily.    . ondansetron (ZOFRAN ODT) 4 MG disintegrating tablet Take 1 tablet (4 mg total) by mouth every 8 (eight) hours as needed. 20 tablet 6  . SYMBICORT 160-4.5 MCG/ACT inhaler Inhale 2 puffs into the lungs daily.     Marland Kitchen tiZANidine (ZANAFLEX) 4 MG tablet Take 1 tablet (4 mg total) by mouth every 6 (six) hours as needed for muscle spasms. 30 tablet 6  . lamoTRIgine (LAMICTAL) 100 MG tablet Take 1 tablet (100 mg total) by mouth 2 (two) times daily. 60 tablet 11  . butalbital-acetaminophen-caffeine (FIORICET, ESGIC) 50-325-40 MG tablet TAKE 1 TABLET BY MOUTH EVERY 6 HOURS AS NEEDED FOR HEADACHE.  Please call [phone] to schedule appt for continue refills. 60 tablet 0  . lamoTRIgine (LAMICTAL) 25 MG tablet 1 tab bid x one week 2 tab bid x 2nd  week 3 tab bid x 3rd week 84 tablet 0   No facility-administered medications prior to visit.    PAST MEDICAL HISTORY: Past Medical History:  Diagnosis Date  . Fibromyalgia   . Headache   . Migraines   . Osteopenia   . Scoliosis     PAST SURGICAL HISTORY: Past Surgical History:  Procedure Laterality Date  . CYST EXCISION     1986  . LAPAROTOMY     x 3    FAMILY HISTORY: Family History  Problem Relation Age of Onset  .  Ovarian cancer Mother   . Brain cancer Father     SOCIAL HISTORY: Social History   Socioeconomic History  . Marital status: Divorced    Spouse name: Not on file  . Number of children: 0  . Years of education: college  . Highest education level: Bachelor's degree (e.g., BA, AB, BS)  Occupational History  . Occupation: Retired  Tobacco Use  . Smoking status: Former Smoker    Packs/day: 1.00    Years: 44.00    Pack years: 44.00    Types: Cigarettes    Quit date: 09/24/2015    Years since quitting: 4.3  . Smokeless tobacco: Never Used  Substance and Sexual Activity  . Alcohol use: No    Alcohol/week: 0.0 standard drinks  . Drug use: No  . Sexual activity: Not on file  Other Topics Concern  . Not on file  Social History Narrative   Patient lives at home alone w/cat.   Patient is retired and has her B.S.    Caffeine - two cups daily.   Patient is divorced.    Patient has a college education.    Patient has no children.       Social Determinants of Health   Financial Resource Strain:   . Difficulty of Paying Living Expenses:   Food Insecurity:   . Worried About Programme researcher, broadcasting/film/video in the Last Year:   . Barista in the Last Year:   Transportation Needs:   . Freight forwarder (Medical):   Marland Kitchen Lack of Transportation (Non-Medical):   Physical Activity:   . Days of Exercise per Week:   . Minutes of Exercise per Session:   Stress:   . Feeling of Stress :   Social Connections:   . Frequency of Communication with Friends and Family:   . Frequency of Social Gatherings with Friends and Family:   . Attends Religious Services:   . Active Member of Clubs or Organizations:   . Attends Banker Meetings:   Marland Kitchen Marital Status:   Intimate Partner Violence:   . Fear of Current or Ex-Partner:   . Emotionally Abused:   Marland Kitchen Physically Abused:   . Sexually Abused:    PHYSICAL EXAM  Vitals:   01/18/20 1320  BP: (!) 162/78  Pulse: 80  Temp: (!) 97.1 F  (36.2 C)  Weight: 242 lb 12.8 oz (110.1 kg)  Height: 5\' 6"  (1.676 m)   Body mass index is 39.19 kg/m.  Generalized: Well developed, in no acute distress   Neurological examination  Mentation: Alert oriented to time, place, history taking. Follows all commands speech and language fluent Cranial nerve II-XII: Pupils were equal round reactive to light. Extraocular movements were full, visual field were full on confrontational test. Facial sensation and strength were normal. Head turning and shoulder shrug  were normal and symmetric. Motor: The motor testing reveals 5 over 5 strength of  all 4 extremities. Good symmetric motor tone is noted throughout.  Sensory: Sensory testing is intact to soft touch on all 4 extremities. No evidence of extinction is noted.  Coordination: Cerebellar testing reveals good finger-nose-finger and heel-to-shin bilaterally.  Gait and station: Gait is normal, but slightly antalgic on the right Reflexes: Deep tendon reflexes are symmetric and normal bilaterally.   DIAGNOSTIC DATA (LABS, IMAGING, TESTING) - I reviewed patient records, labs, notes, testing and imaging myself where available.  Lab Results  Component Value Date   WBC 4.0 07/04/2010   HGB 13.9 07/04/2010   HCT 41.0 07/04/2010   MCV 95.0 07/04/2010   PLT 144 (L) 07/04/2010      Component Value Date/Time   NA 145 07/04/2010 1722   K 3.8 07/04/2010 1722   CL 119 (H) 07/04/2010 1722   GLUCOSE 95 07/04/2010 1722   BUN 19 07/04/2010 1722   CREATININE 1.1 07/04/2010 1722   No results found for: CHOL, HDL, LDLCALC, LDLDIRECT, TRIG, CHOLHDL No results found for: ZDGU4Q No results found for: VITAMINB12 No results found for: TSH  ASSESSMENT AND PLAN 69 y.o. year old female  has a past medical history of Fibromyalgia, Headache, Migraines, Osteopenia, and Scoliosis. here with:  1.  Chronic migraine headaches -Overall, doing quite well on Lamictal, on average 1 headache a week, this is the " gold  standard for her" -Continue Lamictal 100 mg twice a day -Continue Fioricet as needed, may combine with Zofran, tizanidine, even Aleve for acute headache -Follow-up in 8 months or sooner if needed  I spent 20 minutes of face-to-face and non-face-to-face time with patient.  This included previsit chart review, lab review, study review, order entry, electronic health record documentation, patient education.  Margie Ege, AGNP-C, DNP 01/18/2020, 2:08 PM [facility] [address] [place], [place] [address] [phone]

## 2020-01-18 NOTE — Patient Instructions (Signed)
It was nice to meet you today! Continue current medications  See you back in 8 months or sooner if needed

## 2020-02-06 NOTE — Progress Notes (Signed)
I have reviewed and agreed above plan. 

## 2020-02-12 NOTE — Progress Notes (Signed)
Cardiology Office Note:   Date:  02/13/2020  NAME:  Stephanie Finley    MRN: [id number] DOB:  1950-11-24   PCP:  Glenis Smoker, MD  Cardiologist:  Evalina Field, MD   Referring MD: Glenis Smoker, *   Chief Complaint  Patient presents with  . Palpitations   History of Present Illness:   Stephanie Finley is a 69 y.o. female with a hx of fibromyalgia who is being seen today for the evaluation of palpitations at the request of Glenis Smoker, MD.  She reports for the last 6 weeks she has had episodes of palpitations.  She reports a sensation of hard beating.  She feels that her heart is beating fast and may be irregularly at times.  It occurs at least once per day.  Mainly occurs around 3:30 in the morning.  She reports she wakes up and feels a sense of abnormal sensation in her chest.  She is attributed to anxiety as well as asthma.  She reports that she does snore and has frequent daily fatigue.  This has coincided with a diagnosis of hypothyroidism.  Her most recent TSH is 6.2.  She is working with her primary care physician to get this treated.  There is concern for sleep apnea on my review.  She reports that she does snore at home.  She does not appear to have symptoms of palpitations at other times of the day.  She does not consume excess caffeine.  She drinks 1 cup of coffee per day at most.  No excess energy drinks or any alcohol use reported.  She used to smoke and smoked for 30 years.  She is quit about 4 years ago.  She does report exertional shortness of breath but this is been attributed to asthma.  She does have some lower extremity edema but this is likely hypothyroidism related.  She is losing weight and not gaining weight.  She denies any chest pain or pressure with exertion.  She does not exercise routinely.  Her main activity level is around the house doing chores.  She reports some shortness of breath with this.  She is extremely anxious and does have a history of  fibromyalgia.  Her blood pressure is elevated today but she does report that she has whitecoat hypertension.  She will bring a log at her next visit.  No strong family history of heart disease.  No heart procedures reported.  She denies chest pain, shortness of breath or palpitations today in the office.  Total cholesterol 163, HDL 43, LDL 102, triglycerides 90, TSH 6.2  Past Medical History: Past Medical History:  Diagnosis Date  . Fibromyalgia   . Headache   . Migraines   . Osteopenia   . Scoliosis     Past Surgical History: Past Surgical History:  Procedure Laterality Date  . CYST EXCISION     1986  . LAPAROTOMY     x 3    Current Medications: Current Meds  Medication Sig  . albuterol (VENTOLIN HFA) 108 (90 Base) MCG/ACT inhaler Inhale 2 puffs into the lungs every 4 (four) hours as needed.   . butalbital-acetaminophen-caffeine (FIORICET) 50-325-40 MG tablet Take 1 tablet by mouth every 6 (six) hours as needed for headache.  . calcium carbonate (TUMS - DOSED IN MG ELEMENTAL CALCIUM) 500 MG chewable tablet Chew 1 tablet by mouth as needed for indigestion or heartburn.  Marland Kitchen FLUoxetine (PROZAC) 20 MG capsule Take 20 mg by mouth daily.  Marland Kitchen  fluticasone (FLONASE) 50 MCG/ACT nasal spray Place 2 sprays into both nostrils as needed.   Marland Kitchen levothyroxine (SYNTHROID) 50 MCG tablet Take 50 mcg by mouth every morning.  Marland Kitchen omeprazole (PRILOSEC) 10 MG capsule Take 10 mg by mouth daily.  . ondansetron (ZOFRAN ODT) 4 MG disintegrating tablet Take 1 tablet (4 mg total) by mouth every 8 (eight) hours as needed.  . SYMBICORT 160-4.5 MCG/ACT inhaler Inhale 2 puffs into the lungs daily.   . [DISCONTINUED] lamoTRIgine (LAMICTAL) 100 MG tablet Take 1 tablet (100 mg total) by mouth 2 (two) times daily.  . [DISCONTINUED] tiZANidine (ZANAFLEX) 4 MG tablet Take 1 tablet (4 mg total) by mouth every 6 (six) hours as needed for muscle spasms.     Allergies:    Penicillins   Social History: Social History    Socioeconomic History  . Marital status: Divorced    Spouse name: Not on file  . Number of children: 0  . Years of education: college  . Highest education level: Bachelor's degree (e.g., BA, AB, BS)  Occupational History  . Occupation: Retired  Tobacco Use  . Smoking status: Former Smoker    Packs/day: 1.00    Years: 30.00    Pack years: 30.00    Types: Cigarettes    Quit date: 09/24/2015    Years since quitting: 4.3  . Smokeless tobacco: Never Used  Substance and Sexual Activity  . Alcohol use: No    Alcohol/week: 0.0 standard drinks  . Drug use: No  . Sexual activity: Not on file  Other Topics Concern  . Not on file  Social History Narrative   Patient lives at home alone w/cat.   Patient is retired and has her B.S.    Caffeine - two cups daily.   Patient is divorced.    Patient has a college education.    Patient has no children.       Social Determinants of Health   Financial Resource Strain:   . Difficulty of Paying Living Expenses:   Food Insecurity:   . Worried About Programme researcher, broadcasting/film/video in the Last Year:   . Barista in the Last Year:   Transportation Needs:   . Freight forwarder (Medical):   Marland Kitchen Lack of Transportation (Non-Medical):   Physical Activity:   . Days of Exercise per Week:   . Minutes of Exercise per Session:   Stress:   . Feeling of Stress :   Social Connections:   . Frequency of Communication with Friends and Family:   . Frequency of Social Gatherings with Friends and Family:   . Attends Religious Services:   . Active Member of Clubs or Organizations:   . Attends Banker Meetings:   Marland Kitchen Marital Status:      Family History: The patient's family history includes Brain cancer in her father; Heart attack in her paternal uncle; Ovarian cancer in her mother; Rheum arthritis in her sister.  ROS:   All other ROS reviewed and negative. Pertinent positives noted in the HPI.     EKGs/Labs/Other Studies Reviewed:   The  following studies were personally reviewed by me today:  EKG:  EKG is ordered today.  The ekg ordered today demonstrates normal sinus rhythm, heart rate 79, left axis deviation noted, poor R wave progression, questionable inferior infarct, questionable anterior infarct, and was personally reviewed by me.   Recent Labs: No results found for requested labs within last 8760 hours.   Recent Lipid  Panel No results found for: CHOL, TRIG, HDL, CHOLHDL, VLDL, LDLCALC, LDLDIRECT  Physical Exam:   VS:  BP (!) 152/90   Pulse 79   Ht 5\' 3"  (1.6 m)   Wt 239 lb 3.2 oz (108.5 kg)   SpO2 96%   BMI 42.37 kg/m    Wt Readings from Last 3 Encounters:  02/13/20 239 lb 3.2 oz (108.5 kg)  01/18/20 242 lb 12.8 oz (110.1 kg)  07/26/19 243 lb 8 oz (110.5 kg)    General: Well nourished, well developed, in no acute distress Heart: Atraumatic, normal size  Eyes: PEERLA, EOMI  Neck: Supple, no JVD Endocrine: No thryomegaly Cardiac: Normal S1, S2; RRR; no murmurs, rubs, or gallops Lungs: Clear to auscultation bilaterally, no wheezing, rhonchi or rales  Abd: Soft, nontender, no hepatomegaly  Ext: Trace edema Musculoskeletal: No deformities, BUE and BLE strength normal and equal Skin: Warm and dry, no rashes   Neuro: Alert and oriented to person, place, time, and situation, CNII-XII grossly intact, no focal deficits  Psych: Normal mood and affect   ASSESSMENT:   Stephanie Finley is a 69 y.o. female who presents for the following: 1. Palpitations   2. SOB (shortness of breath)   3. Snoring   4. Chronic fatigue   5. Obesity, morbid, BMI 40.0-49.9 (HCC)     PLAN:   1. Palpitations -Daily episodes of palpitations at night.  They do wake her up.  She does snore and have fatigue.  This has coincided with hypothyroidism.  Her symptoms could be related to hypothyroidism versus untreated sleep apnea.  I think we do need to delineate what is going on to exclude any arrhythmia.  She does have a considerable smoking  history which puts her at risk for atrial fibrillation.  Given her daily symptoms we will proceed with a 3-day Zio patch.  I think this is reasonable.  See sleep study below.  2. SOB (shortness of breath) -She does have some exertional shortness of breath.  She has asthma.  No evidence of volume overload or heart failure on examination today.  Her EKG today demonstrates normal sinus rhythm with likely poor R wave progression related to obesity.  We will see how she does over the next 2 months.  We likely will pursue an echocardiogram if an abnormality is found on her Zio patch.  3. Snoring 4. Chronic fatigue -Symptoms could be related to hypothyroidism.  She is obese.  We will proceed with a home sleep study.   Disposition: Return in about 2 months (around 04/14/2020).  Medication Adjustments/Labs and Tests Ordered: Current medicines are reviewed at length with the patient today.  Concerns regarding medicines are outlined above.  Orders Placed This Encounter  Procedures  . LONG TERM MONITOR (3-14 DAYS)  . EKG 12-Lead  . Home sleep test   No orders of the defined types were placed in this encounter.   Patient Instructions  Medication Instructions:  The current medical regimen is effective;  continue present plan and medications.  *If you need a refill on your cardiac medications before your next appointment, please call your pharmacy*   Testing/Procedures: Your physician has recommended that you have a sleep study. This test records several body functions during sleep, including: brain activity, eye movement, oxygen and carbon dioxide blood levels, heart rate and rhythm, breathing rate and rhythm, the flow of air through your mouth and nose, snoring, body muscle movements, and chest and belly movement.  Your physician has recommended that you  wear a 3 DAY ZIO-PATCH monitor. The Zio patch cardiac monitor continuously records heart rhythm data for up to 14 days, this is for patients  being evaluated for multiple types heart rhythms. For the first 24 hours post application, please avoid getting the Zio monitor wet in the shower or by excessive sweating during exercise. After that, feel free to carry on with regular activities. Keep soaps and lotions away from the ZIO XT Patch.  This will be mailed to you, please expect 7-10 days to receive.           Follow-Up: At [facility], you and your health needs are our priority.  As part of our continuing mission to provide you with exceptional heart care, we have created designated Provider Care Teams.  These Care Teams include your primary Cardiologist (physician) and Advanced Practice Providers (APPs -  Physician Assistants and Nurse Practitioners) who all work together to provide you with the care you need, when you need it.  We recommend signing up for the patient portal called "MyChart".  Sign up information is provided on this After Visit Summary.  MyChart is used to connect with patients for Virtual Visits (Telemedicine).  Patients are able to view lab/test results, encounter notes, upcoming appointments, etc.  Non-urgent messages can be sent to your provider as well.   To learn more about what you can do with MyChart, go to [email or web address].    Your next appointment:   2 month(s)  The format for your next appointment:   In Person  Provider:   Lennie Odor, MD        Signed, Lenna Gilford. Flora Lipps, MD [facility]  [address], [address] [place], [place] [address] [phone]  02/13/2020 3:10 PM

## 2020-02-13 ENCOUNTER — Telehealth: Payer: Self-pay | Admitting: *Deleted

## 2020-02-13 ENCOUNTER — Encounter: Payer: Self-pay | Admitting: Cardiovascular Disease

## 2020-02-13 ENCOUNTER — Ambulatory Visit: Payer: Medicare Other | Admitting: Cardiovascular Disease

## 2020-02-13 ENCOUNTER — Other Ambulatory Visit: Payer: Self-pay

## 2020-02-13 VITALS — BP 152/90 | HR 79 | Ht 63.0 in | Wt 239.2 lb

## 2020-02-13 DIAGNOSIS — R5382 Chronic fatigue, unspecified: Secondary | ICD-10-CM | POA: Diagnosis not present

## 2020-02-13 DIAGNOSIS — R0683 Snoring: Secondary | ICD-10-CM | POA: Diagnosis not present

## 2020-02-13 DIAGNOSIS — R002 Palpitations: Secondary | ICD-10-CM

## 2020-02-13 DIAGNOSIS — R0602 Shortness of breath: Secondary | ICD-10-CM | POA: Diagnosis not present

## 2020-02-13 NOTE — Telephone Encounter (Signed)
-----   Message from Leotis Pain sent at 02/13/2020  2:12 PM EDT ----- Regarding: Sleep Study Dr. Flora Lipps

## 2020-02-13 NOTE — Patient Instructions (Signed)
Medication Instructions:  The current medical regimen is effective;  continue present plan and medications.  *If you need a refill on your cardiac medications before your next appointment, please call your pharmacy*   Testing/Procedures: Your physician has recommended that you have a sleep study. This test records several body functions during sleep, including: brain activity, eye movement, oxygen and carbon dioxide blood levels, heart rate and rhythm, breathing rate and rhythm, the flow of air through your mouth and nose, snoring, body muscle movements, and chest and belly movement.  Your physician has recommended that you wear a 3 DAY ZIO-PATCH monitor. The Zio patch cardiac monitor continuously records heart rhythm data for up to 14 days, this is for patients being evaluated for multiple types heart rhythms. For the first 24 hours post application, please avoid getting the Zio monitor wet in the shower or by excessive sweating during exercise. After that, feel free to carry on with regular activities. Keep soaps and lotions away from the ZIO XT Patch.  This will be mailed to you, please expect 7-10 days to receive.           Follow-Up: At [facility], you and your health needs are our priority.  As part of our continuing mission to provide you with exceptional heart care, we have created designated Provider Care Teams.  These Care Teams include your primary Cardiologist (physician) and Advanced Practice Providers (APPs -  Physician Assistants and Nurse Practitioners) who all work together to provide you with the care you need, when you need it.  We recommend signing up for the patient portal called "MyChart".  Sign up information is provided on this After Visit Summary.  MyChart is used to connect with patients for Virtual Visits (Telemedicine).  Patients are able to view lab/test results, encounter notes, upcoming appointments, etc.  Non-urgent messages can be sent to your provider as  well.   To learn more about what you can do with MyChart, go to [email or web address].    Your next appointment:   2 month(s)  The format for your next appointment:   In Person  Provider:   Lennie Odor, MD

## 2020-02-15 ENCOUNTER — Ambulatory Visit (INDEPENDENT_AMBULATORY_CARE_PROVIDER_SITE_OTHER): Payer: Medicare Other

## 2020-02-15 DIAGNOSIS — R002 Palpitations: Secondary | ICD-10-CM

## 2020-03-06 ENCOUNTER — Telehealth: Payer: Self-pay | Admitting: Cardiovascular Disease

## 2020-03-06 NOTE — Telephone Encounter (Signed)
Please advise- just questioning where we are with sleep study. Thank you!

## 2020-03-06 NOTE — Telephone Encounter (Signed)
New Message:     Pt said she waiting to be scheduled for a Sleep Study, had not heard anything from it and it had been since May,

## 2020-03-06 NOTE — Telephone Encounter (Signed)
Spoke with patient these are taking longer than usual to get scheduled but would send another message regarding sleep study

## 2020-03-07 NOTE — Telephone Encounter (Signed)
Awaiting authorization. Thanks

## 2020-03-14 ENCOUNTER — Telehealth: Payer: Self-pay | Admitting: *Deleted

## 2020-03-14 NOTE — Telephone Encounter (Signed)
HST scheduled per Dr Flora Lipps. patient will be notified via MyChart.

## 2020-03-28 ENCOUNTER — Telehealth: Payer: Self-pay | Admitting: Cardiovascular Disease

## 2020-03-28 NOTE — Telephone Encounter (Signed)
Please order home sleep study.   Gerri Spore T. Flora Lipps, MD [facility]  [address] [place], [place] [address] [phone]  6:53 PM

## 2020-03-28 NOTE — Telephone Encounter (Signed)
Patient states that her and Dr. Flora Lipps had discussed her having a at home sleep apnea test. She ended up getting a packet from [facility] and the copay is $300. She would like to discuss with Dr. Flora Lipps about doing the at home sleep apnea test rather than going onsite somewhere. Please advise.

## 2020-03-29 ENCOUNTER — Other Ambulatory Visit: Payer: Self-pay

## 2020-03-29 DIAGNOSIS — R5382 Chronic fatigue, unspecified: Secondary | ICD-10-CM

## 2020-03-29 NOTE — Telephone Encounter (Signed)
Home sleep study ordered-  Will notify sleep coordinator.   Thanks!

## 2020-04-24 ENCOUNTER — Ambulatory Visit: Payer: Medicare Other | Admitting: Cardiovascular Disease

## 2020-05-27 ENCOUNTER — Encounter (HOSPITAL_BASED_OUTPATIENT_CLINIC_OR_DEPARTMENT_OTHER): Payer: Medicare Other | Admitting: Cardiovascular Disease

## 2020-06-05 NOTE — Progress Notes (Signed)
Cardiology Office Note:   Date:  06/06/2020  NAME:  Stephanie Finley    MRN: [id number] DOB:  1951-05-16   PCP:  Shon Hale, MD  Cardiologist:  Reatha Harps, MD  Electrophysiologist:  None   Referring MD: Shon Hale, *   Chief Complaint  Patient presents with  . Shortness of Breath   History of Present Illness:   Stephanie Finley is a 69 y.o. female with a hx of fibromyalgia, migraines who presents for follow-up of SOB/palpitaitons. She was evaluated recently for palpitations and SOB. Zio monitor without arrhythmias.  She was found to be hypothyroid prior to her last visit.  She is been treated for this and has done well.  She reports her shortness of breath or palpitations have improved.  She is lost 8 pounds since her last visit.  She is working on diet and exercising more.  She was unable to complete her home sleep study.  Apparently this was scheduled as an onsite study and the co-pay was too high.  She would like to complete a home sleep study.  She denies any chest pain, shortness of breath or palpitations today.   Past Medical History: Past Medical History:  Diagnosis Date  . Fibromyalgia   . Headache   . Migraines   . Osteopenia   . Scoliosis     Past Surgical History: Past Surgical History:  Procedure Laterality Date  . CYST EXCISION     1986  . LAPAROTOMY     x 3    Current Medications: Current Meds  Medication Sig  . albuterol (VENTOLIN HFA) 108 (90 Base) MCG/ACT inhaler Inhale 2 puffs into the lungs every 4 (four) hours as needed.   . butalbital-acetaminophen-caffeine (FIORICET) 50-325-40 MG tablet Take 1 tablet by mouth every 6 (six) hours as needed for headache.  . calcium carbonate (TUMS - DOSED IN MG ELEMENTAL CALCIUM) 500 MG chewable tablet Chew 1 tablet by mouth as needed for indigestion or heartburn.  Marland Kitchen FLUoxetine (PROZAC) 20 MG capsule Take 20 mg by mouth daily.  . fluticasone (FLONASE) 50 MCG/ACT nasal spray Place 2 sprays into both  nostrils as needed.   Marland Kitchen levothyroxine (SYNTHROID) 50 MCG tablet Take 50 mcg by mouth every morning.  Marland Kitchen omeprazole (PRILOSEC) 10 MG capsule Take 10 mg by mouth daily.  . ondansetron (ZOFRAN ODT) 4 MG disintegrating tablet Take 1 tablet (4 mg total) by mouth every 8 (eight) hours as needed.  . SYMBICORT 160-4.5 MCG/ACT inhaler Inhale 2 puffs into the lungs daily.      Allergies:    Penicillins   Social History: Social History   Socioeconomic History  . Marital status: Divorced    Spouse name: Not on file  . Number of children: 0  . Years of education: college  . Highest education level: Bachelor's degree (e.g., BA, AB, BS)  Occupational History  . Occupation: Retired  Tobacco Use  . Smoking status: Former Smoker    Packs/day: 1.00    Years: 30.00    Pack years: 30.00    Types: Cigarettes    Quit date: 09/24/2015    Years since quitting: 4.7  . Smokeless tobacco: Never Used  Substance and Sexual Activity  . Alcohol use: No    Alcohol/week: 0.0 standard drinks  . Drug use: No  . Sexual activity: Not on file  Other Topics Concern  . Not on file  Social History Narrative   Patient lives at home alone w/cat.   Patient  is retired and has her B.S.    Caffeine - two cups daily.   Patient is divorced.    Patient has a college education.    Patient has no children.       Social Determinants of Health   Financial Resource Strain:   . Difficulty of Paying Living Expenses: Not on file  Food Insecurity:   . Worried About Programme researcher, broadcasting/film/video in the Last Year: Not on file  . Ran Out of Food in the Last Year: Not on file  Transportation Needs:   . Lack of Transportation (Medical): Not on file  . Lack of Transportation (Non-Medical): Not on file  Physical Activity:   . Days of Exercise per Week: Not on file  . Minutes of Exercise per Session: Not on file  Stress:   . Feeling of Stress : Not on file  Social Connections:   . Frequency of Communication with Friends and Family:  Not on file  . Frequency of Social Gatherings with Friends and Family: Not on file  . Attends Religious Services: Not on file  . Active Member of Clubs or Organizations: Not on file  . Attends Banker Meetings: Not on file  . Marital Status: Not on file    Family History: The patient's family history includes Brain cancer in her father; Heart attack in her paternal uncle; Ovarian cancer in her mother; Rheum arthritis in her sister.  ROS:   All other ROS reviewed and negative. Pertinent positives noted in the HPI.     EKGs/Labs/Other Studies Reviewed:   The following studies were personally reviewed by me today:  Zio 03/07/2020  1. No significant arrhythmias.  2. Symptoms coincide with normal sinus rhythm.  3. Rare ectopy.    Recent Labs: No results found for requested labs within last 8760 hours.   Recent Lipid Panel No results found for: CHOL, TRIG, HDL, CHOLHDL, VLDL, LDLCALC, LDLDIRECT  Physical Exam:   VS:  BP (!) 178/92   Pulse 94   Ht 5\' 3"  (1.6 m)   Wt 234 lb (106.1 kg)   SpO2 94%   BMI 41.45 kg/m    Wt Readings from Last 3 Encounters:  06/06/20 234 lb (106.1 kg)  02/13/20 239 lb 3.2 oz (108.5 kg)  01/18/20 242 lb 12.8 oz (110.1 kg)    General: Well nourished, well developed, in no acute distress Heart: Atraumatic, normal size  Eyes: PEERLA, EOMI  Neck: Supple, no JVD Endocrine: No thryomegaly Cardiac: Normal S1, S2; RRR; no murmurs, rubs, or gallops Lungs: Clear to auscultation bilaterally, no wheezing, rhonchi or rales  Abd: Soft, nontender, no hepatomegaly  Ext: No edema, pulses 2+ Musculoskeletal: No deformities, BUE and BLE strength normal and equal Skin: Warm and dry, no rashes   Neuro: Alert and oriented to person, place, time, and situation, CNII-XII grossly intact, no focal deficits  Psych: Normal mood and affect   ASSESSMENT:   [name] is a 69 y.o. female who presents for the following: 1. SOB (shortness of breath)   2.  Palpitations   3. Snoring   4. Chronic fatigue     PLAN:   1. SOB (shortness of breath) 2. Palpitations -Symptoms have improved with treatment of hypothyroidism.  No significant arrhythmias.  No evidence of heart failure on exam.  Normal cardiovascular exam.  Normal EKG.  No need for further cardiac testing.  3. Snoring 4. Chronic fatigue -Needs to complete her home sleep study.  Apparently there was  an issue with how this is ordered.  She would like to complete a home sleep study as this will be cheaper.  Disposition: Return if symptoms worsen or fail to improve.  Medication Adjustments/Labs and Tests Ordered: Current medicines are reviewed at length with the patient today.  Concerns regarding medicines are outlined above.  No orders of the defined types were placed in this encounter.  No orders of the defined types were placed in this encounter.   There are no Patient Instructions on file for this visit.   Time Spent with Patient: I have spent a total of 25 minutes with patient reviewing hospital notes, telemetry, EKGs, labs and examining the patient as well as establishing an assessment and plan that was discussed with the patient.  > 50% of time was spent in direct patient care.  Signed, Lenna Gilford. Flora Lipps, MD [facility]  [address] [place], [place] [address] [phone]  06/06/2020 2:52 PM

## 2020-06-06 ENCOUNTER — Encounter: Payer: Self-pay | Admitting: Cardiovascular Disease

## 2020-06-06 ENCOUNTER — Ambulatory Visit: Payer: Medicare Other | Admitting: Cardiovascular Disease

## 2020-06-06 ENCOUNTER — Telehealth: Payer: Self-pay | Admitting: *Deleted

## 2020-06-06 ENCOUNTER — Other Ambulatory Visit: Payer: Self-pay

## 2020-06-06 VITALS — BP 178/92 | HR 94 | Ht 63.0 in | Wt 234.0 lb

## 2020-06-06 DIAGNOSIS — R0683 Snoring: Secondary | ICD-10-CM

## 2020-06-06 DIAGNOSIS — R5382 Chronic fatigue, unspecified: Secondary | ICD-10-CM | POA: Diagnosis not present

## 2020-06-06 DIAGNOSIS — R002 Palpitations: Secondary | ICD-10-CM | POA: Diagnosis not present

## 2020-06-06 DIAGNOSIS — R0602 Shortness of breath: Secondary | ICD-10-CM

## 2020-06-06 NOTE — Patient Instructions (Signed)
Medication Instructions:  The current medical regimen is effective;  continue present plan and medications.  *If you need a refill on your cardiac medications before your next appointment, please call your pharmacy*   Testing/Procedures: Your physician has recommended that you have a sleep study. This test records several body functions during sleep, including: brain activity, eye movement, oxygen and carbon dioxide blood levels, heart rate and rhythm, breathing rate and rhythm, the flow of air through your mouth and nose, snoring, body muscle movements, and chest and belly movement.   Follow-Up: At [facility], you and your health needs are our priority.  As part of our continuing mission to provide you with exceptional heart care, we have created designated Provider Care Teams.  These Care Teams include your primary Cardiologist (physician) and Advanced Practice Providers (APPs -  Physician Assistants and Nurse Practitioners) who all work together to provide you with the care you need, when you need it.  We recommend signing up for the patient portal called "MyChart".  Sign up information is provided on this After Visit Summary.  MyChart is used to connect with patients for Virtual Visits (Telemedicine).  Patients are able to view lab/test results, encounter notes, upcoming appointments, etc.  Non-urgent messages can be sent to your provider as well.   To learn more about what you can do with MyChart, go to [email or web address].    Your next appointment:   As needed  The format for your next appointment:   In Person  Provider:   [place] O'Neal, MD      

## 2020-06-06 NOTE — Telephone Encounter (Signed)
Patient notified of HST appointment. 

## 2020-07-19 ENCOUNTER — Ambulatory Visit (HOSPITAL_BASED_OUTPATIENT_CLINIC_OR_DEPARTMENT_OTHER): Payer: Medicare Other | Attending: Cardiovascular Disease | Admitting: Cardiovascular Disease

## 2020-07-19 ENCOUNTER — Other Ambulatory Visit: Payer: Self-pay

## 2020-07-19 DIAGNOSIS — G4733 Obstructive sleep apnea (adult) (pediatric): Secondary | ICD-10-CM | POA: Diagnosis not present

## 2020-07-19 DIAGNOSIS — G4710 Hypersomnia, unspecified: Secondary | ICD-10-CM | POA: Insufficient documentation

## 2020-07-19 DIAGNOSIS — R0683 Snoring: Secondary | ICD-10-CM | POA: Insufficient documentation

## 2020-07-19 DIAGNOSIS — R5382 Chronic fatigue, unspecified: Secondary | ICD-10-CM | POA: Insufficient documentation

## 2020-07-29 NOTE — Progress Notes (Signed)
Her symptoms include snoring and chronic fatigue.  She is tired most of the day.  This should be an indication for a home sleep study.  Stephanie Finley T. Flora Lipps, MD [facility]  [address], [address] [place], [place] [address] [phone]  9:35 AM

## 2020-07-29 NOTE — Progress Notes (Signed)
Note updated to reflect symptoms of snoring and fatigue.  Gerri Spore T. Flora Lipps, MD [facility]  [address] [place], [place] [address] [phone]  9:35 AM

## 2020-07-30 ENCOUNTER — Telehealth: Payer: Self-pay | Admitting: *Deleted

## 2020-07-30 ENCOUNTER — Encounter (HOSPITAL_BASED_OUTPATIENT_CLINIC_OR_DEPARTMENT_OTHER): Payer: Self-pay | Admitting: Cardiovascular Disease

## 2020-07-30 DIAGNOSIS — G4733 Obstructive sleep apnea (adult) (pediatric): Secondary | ICD-10-CM

## 2020-07-30 NOTE — Telephone Encounter (Signed)
-----   Message from Thomas A Kelly, MD sent at 07/30/2020  1:15 PM EDT ----- °[name], please notify pt of results, rec CPAP, if cannot schedule in lab, then Auto-PAP 6-16 cm °

## 2020-07-30 NOTE — Telephone Encounter (Signed)
Pt is aware and agreeable to her results. °

## 2020-07-30 NOTE — Procedures (Signed)
    Patient Name: Stephanie Finley, Stephanie Finley Date: 07/20/2020 Gender: Female D.O.B: 09/29/1950 Age (years): 69 Referring Provider: Ronnald Ramp ONeal Height (inches): 64 Interpreting Physician: Nicki Guadalajara MD, ABSM Weight (lbs): 237 RPSGT: [place] Sink BMI: 41 MRN: [id number] Neck Size: 15.00  CLINICAL INFORMATION Sleep Study Type: HST  Indication for sleep study: snoring, fatigue  Epworth Sleepiness Score: 3  SLEEP STUDY TECHNIQUE A multi-channel overnight portable sleep study was performed. The channels recorded were: nasal airflow, thoracic respiratory movement, and oxygen saturation with a pulse oximetry. Snoring was also monitored.  MEDICATIONS albuterol (VENTOLIN HFA) 108 (90 Base) MCG/ACT inhaler butalbital-acetaminophen-caffeine (FIORICET) 50-325-40 MG tablet calcium carbonate (TUMS - DOSED IN MG ELEMENTAL CALCIUM) 500 MG chewable tablet FLUoxetine (PROZAC) 20 MG capsule fluticasone (FLONASE) 50 MCG/ACT nasal spray levothyroxine (SYNTHROID) 50 MCG tablet omeprazole (PRILOSEC) 10 MG capsule ondansetron (ZOFRAN ODT) 4 MG disintegrating tablet SYMBICORT 160-4.5 MCG/ACT inhaler Patient self administered medications include: N/A.  SLEEP ARCHITECTURE Patient was studied for 388.5 minutes. The sleep efficiency was 100.0 % and the patient was supine for 77.5%. The arousal index was 0.0 per hour.  RESPIRATORY PARAMETERS The overall AHI was 6.3 per hour, with a central apnea index of 0.0 per hour.  The oxygen nadir was 79% during sleep. Time spent <89% was 121 minutes.  CARDIAC DATA Mean heart rate during sleep was 76.4 bpm.  IMPRESSIONS - Mild obstructive sleep apnea occurred during this study (AHI 6.3/h). The severity during REM sleep cannot be assessed on this home study. - No significant central sleep apnea occurred during this study (CAI = 0.0/h). - Severe oxygen desaturation to a nadir of 79%. - Patient snored 0.1% during the sleep.  DIAGNOSIS -  Obstructive Sleep Apnea (G47.33) - Nocturnal Hypoxemia (G47.36)  RECOMMENDATIONS - Therapeutic CPAP titration to determine optimal pressure required to alleviate sleep disordered breathing. If unable to schedule for an in-lab titration, initiate Auto-PAP at 6-15 cm of water. - Effort should be made to optimize nasal and oropharyngeal patency. - Alterative to CPAP such as a customized oral appliance caan be considered. - Avoid alcohol, sedatives and other CNS depressants that may worsen sleep apnea and disrupt normal sleep architecture. - Sleep hygiene should be reviewed to assess factors that may improve sleep quality. - Weight management (BMI 41) and regular exercise should be initiated or continued. - Return to Sleep Center to discuss the results of this study   [Electronically signed] 07/30/2020 01:09 PM  Nicki Guadalajara MD, [facility], ABSM Diplomate, American Board of Sleep Medicine   NPI: [id number] [facility] PH: [phone]   FX: [phone] ACCREDITED BY THE AMERICAN ACADEMY OF SLEEP MEDICINE

## 2020-07-30 NOTE — Telephone Encounter (Signed)
Patient is returning call.  °

## 2020-07-30 NOTE — Telephone Encounter (Signed)
Informed patient of sleep study results and patient understanding was verbalized. Patient understands her sleep study showed. IMPRESSIONS - Mild obstructive sleep apnea occurred during this study (AHI 6.3/h). The severity during REM sleep cannot be assessed on this home study. - Severe oxygen desaturation to a nadir of 79%. - Patient snored 0.1% during the sleep.  DIAGNOSIS - Obstructive Sleep Apnea (G47.33) - Nocturnal Hypoxemia (G47.36)  RECOMMENDATIONS - Therapeutic CPAP titration to determine optimal pressure required to alleviate sleep disordered breathing. If unable to schedule for an in-lab titration, initiate Auto-PAP at 6-15 cm of water.  Left detailed message on voicemail and informed patient to call back with questions.

## 2020-08-03 ENCOUNTER — Other Ambulatory Visit: Payer: Self-pay | Admitting: Neurology

## 2020-08-05 ENCOUNTER — Telehealth: Payer: Self-pay | Admitting: *Deleted

## 2020-08-05 NOTE — Telephone Encounter (Signed)
Informed patient of sleep study results and patient understanding was verbalized. Patient understands her sleep study showed  IMPRESSIONS - Mild obstructive sleep apnea occurred during this study (AHI 6.3/h). The severity during REM sleep cannot be assessed on this home study. - Severe oxygen desaturation to a nadir of 79%. - Patient snored 0.1% during the sleep.  DIAGNOSIS - Obstructive Sleep Apnea (G47.33) - Nocturnal Hypoxemia (G47.36)  RECOMMENDATIONS - Therapeutic CPAP titration to determine optimal pressure required to alleviate sleep disordered breathing. If unable to schedule for an in-lab titration, initiate Auto-PAP at 6-15 cm of water.  PER DPR Left detailed message on voicemail and informed patient to call back with questions

## 2020-08-05 NOTE — Telephone Encounter (Signed)
-----   Message from Lennette Bihari, MD sent at 07/30/2020  1:15 PM EDT ----- Cor[place]e [place], please notify pt of results, rec CPAP, if cannot schedule in lab, then Auto-PAP 6-16 cm

## 2020-08-06 ENCOUNTER — Telehealth: Payer: Self-pay | Admitting: Cardiovascular Disease

## 2020-08-06 NOTE — Telephone Encounter (Signed)
No PA required for Cpap titration.  Waiting on sleep lab to call back to schedule °

## 2020-08-07 NOTE — Telephone Encounter (Signed)
Called patient and told her she was scheduled for Tuesday, Dec. 14 at 8 pm, that she will receive an info packet and gave her the sleep lab number for any questions.

## 2020-09-10 ENCOUNTER — Ambulatory Visit (HOSPITAL_BASED_OUTPATIENT_CLINIC_OR_DEPARTMENT_OTHER): Payer: Medicare Other | Attending: Cardiovascular Disease | Admitting: Cardiovascular Disease

## 2020-09-10 ENCOUNTER — Other Ambulatory Visit: Payer: Self-pay

## 2020-09-10 DIAGNOSIS — G4733 Obstructive sleep apnea (adult) (pediatric): Secondary | ICD-10-CM | POA: Insufficient documentation

## 2020-09-17 ENCOUNTER — Telehealth: Payer: Self-pay | Admitting: *Deleted

## 2020-09-17 ENCOUNTER — Encounter (HOSPITAL_BASED_OUTPATIENT_CLINIC_OR_DEPARTMENT_OTHER): Payer: Self-pay | Admitting: Cardiovascular Disease

## 2020-09-17 NOTE — Telephone Encounter (Signed)
Informed patient of sleep study results and patient understanding was verbalized. Patient understands her sleep study showed   IMPRESSIONS - CPAP was initiated at 5 cm and was titrated to optimal PAP pressure 14 cm of water. AHI at 12 cm was 0.6/h with only 30 seconds of REM sleep and at 14 cm was 0 and O2 nadir at 90% with 11:48 minutes of REM sleep. - Central sleep apnea was not noted during this titration (CAI = 0.0/h). - Moderate oxygen desaturations were observed during this titration to a nadir of 86% at 9 cm of water. - No snoring was audible during this study. - No cardiac abnormalities were observed during this study. - Clinically significant periodic limb movements were not noted during this study. Arousals associated with PLMs were rare.  DIAGNOSIS - Obstructive Sleep Apnea (G47.33)  RECOMMENDATIONS - Recommend an initial trial of CPAP Auto therapy with EPR of 3 at 12 - 18 cm H2O with heated humidification. A Medium size Fisher&Paykel Full Face Mask Simplus mask was used for the titration.  Upon patient request DME selection is [facility]. Patient understands she/he will be contacted by [facility] to set up her/he cpap. Patient understands to call if [facility] does not contact her/he with new setup in a timely manner. Patient understands they will be called once confirmation has been received from [facility] that they have received their new machine to schedule 10 week follow up appointment.   [facility] notified of new cpap order  Please add to airview Patient was grateful for the call and thanked me.

## 2020-09-17 NOTE — Procedures (Signed)
Patient Name: Stephanie Finley, Leisey Date: 09/10/2020 Gender: Female D.O.B: [birth year] Age (years): 69 Referring Provider: Ronnald Ramp ONeal Height (inches): 65 Interpreting Physician: Nicki Guadalajara MD, ABSM Weight (lbs): 234 RPSGT: Rosette Reveal BMI: 39 MRN: [id number] Neck Size: 15.00  CLINICAL INFORMATION The patient is referred for a CPAP titration to treat sleep apnea.  Date of HST: 10/23/20211: AHI 6.3/h, O2 nadir 79%.  SLEEP STUDY TECHNIQUE As per the AASM Manual for the Scoring of Sleep and Associated Events v2.3 (April 2016) with a hypopnea requiring 4% desaturations.  The channels recorded and monitored were frontal, central and occipital EEG, electrooculogram (EOG), submentalis EMG (chin), nasal and oral airflow, thoracic and abdominal wall motion, anterior tibialis EMG, snore microphone, electrocardiogram, and pulse oximetry. Continuous positive airway pressure (CPAP) was initiated at the beginning of the study and titrated to treat sleep-disordered breathing.  MEDICATIONS albuterol (VENTOLIN HFA) 108 (90 Base) MCG/ACT inhaler butalbital-acetaminophen-caffeine (FIORICET) 50-325-40 MG tablet calcium carbonate (TUMS - DOSED IN MG ELEMENTAL CALCIUM) 500 MG chewable tablet FLUoxetine (PROZAC) 20 MG capsule fluticasone (FLONASE) 50 MCG/ACT nasal spray levothyroxine (SYNTHROID) 50 MCG tablet omeprazole (PRILOSEC) 10 MG capsule ondansetron (ZOFRAN ODT) 4 MG disintegrating tablet SYMBICORT 160-4.5 MCG/ACT inhaler Medications self-administered by patient taken the night of the study : N/A  TECHNICIAN COMMENTS Comments added by technician: None Comments added by scorer: N/A  RESPIRATORY PARAMETERS Optimal PAP Pressure (cm): 14 AHI at Optimal Pressure (/hr): 0.0 Overall Minimal O2 (%): 86.0 Supine % at Optimal Pressure (%): 100 Minimal O2 at Optimal Pressure (%): 90.0   SLEEP ARCHITECTURE The study was initiated at 10:46:14 PM and ended at 4:53:34  AM.  Sleep onset time was 27.8 minutes and the sleep efficiency was 70.4%%. The total sleep time was 258.5 minutes.  The patient spent 8.1%% of the night in stage N1 sleep, 72.5%% in stage N2 sleep, 8.5%% in stage N3 and 10.8% in REM.Stage REM latency was 134.0 minutes  Wake after sleep onset was 81.1. Alpha intrusion was absent. Supine sleep was 100.00%.  CARDIAC DATA The 2 lead EKG demonstrated sinus rhythm. The mean heart rate was 70.4 beats per minute. Other EKG findings include: None.  LEG MOVEMENT DATA The total Periodic Limb Movements of Sleep (PLMS) were 0. The PLMS index was 0.0. A PLMS index of <15 is considered normal in adults.  IMPRESSIONS - CPAP was initiated at 5 cm and was titrated to optimal PAP pressure 14 cm of water. AHI at 12 cm was 0.6/h with only 30 seconds of REM sleep and at 14 cm was 0 and O2 nadir at 90% with 11:48 minutes of REM sleep. - Central sleep apnea was not noted during this titration (CAI = 0.0/h). - Moderate oxygen desaturations were observed during this titration to a nadir of 86% at 9 cm of water. - No snoring was audible during this study. - No cardiac abnormalities were observed during this study. - Clinically significant periodic limb movements were not noted during this study. Arousals associated with PLMs were rare.  DIAGNOSIS - Obstructive Sleep Apnea (G47.33)  RECOMMENDATIONS - Recommend an initial trial of CPAP Auto therapy with EPR of 3 at 12 - 18 cm H2O with heated humidification. A Medium size Fisher&Paykel Full Face Mask Simplus mask was used for the titration. - Effort should be made to optimize nasal and oropharyngeal patency. - Avoid alcohol, sedatives and other CNS depressants that may worsen sleep apnea and disrupt normal sleep architecture. - Sleep hygiene should be reviewed to  assess factors that may improve sleep quality. - Weight management and regular exercise should be initiated or continued. - Recommend a download in 30  days and sleep clinic evaluation after 4 weeks of therapy.   [Electronically signed] 09/17/2020 10:47 AM  Nicki Guadalajara MD, [facility], ABSM Diplomate, American Board of Sleep Medicine   NPI: [id number] [facility] PH: [phone]   FX: [phone] ACCREDITED BY THE AMERICAN ACADEMY OF SLEEP MEDICINE

## 2020-09-17 NOTE — Telephone Encounter (Signed)
-----   Message from Lennette Bihari, MD sent at 09/17/2020 10:52 AM EST ----- Stephanie Finley, please notify pt and set up with DME for initiation of CPAP

## 2020-09-18 ENCOUNTER — Ambulatory Visit: Payer: Medicare Other | Admitting: Neurology

## 2020-10-02 ENCOUNTER — Ambulatory Visit: Payer: Medicare Other | Admitting: Neurology

## 2020-11-11 ENCOUNTER — Encounter: Payer: Self-pay | Admitting: Neurology

## 2020-11-11 ENCOUNTER — Telehealth (INDEPENDENT_AMBULATORY_CARE_PROVIDER_SITE_OTHER): Payer: Medicare Other | Admitting: Neurology

## 2020-11-11 DIAGNOSIS — G43709 Chronic migraine without aura, not intractable, without status migrainosus: Secondary | ICD-10-CM | POA: Diagnosis not present

## 2020-11-11 MED ORDER — BUTALBITAL-APAP-CAFFEINE 50-325-40 MG PO TABS: ORAL_TABLET | ORAL | 3 refills | Status: DC

## 2020-11-11 MED ORDER — TIZANIDINE HCL 4 MG PO TABS
2.0000 mg | ORAL_TABLET | Freq: Four times a day (QID) | ORAL | 0 refills | Status: DC | PRN
Start: 2020-11-11 — End: 2021-10-20

## 2020-11-11 NOTE — Progress Notes (Signed)
Virtual Visit via Video Note  I connected with Stephanie Finley on 11/11/20 at  4:00 PM EST by a video enabled telemedicine application and verified that I am speaking with the correct person using two identifiers.  Location: Patient: at her home Provider: in the office    I discussed the limitations of evaluation and management by telemedicine and the availability of in person appointments. The patient expressed understanding and agreed to proceed.  History of Present Illness: Stephanie Finley a 70 year old female, seen in request byher primary care physician Dr.Timberlake, Kathrynfor evaluation of chronic migraine, initial evaluation was on July 26, 2019.  I have reviewed and summarized the referring note from the referring physician.She had past medical history of chronic migraine since childhood, her typical migraine lateralized retro-orbital area severe pounding headache with associated light, noise sensitivity, nauseous, lasting for few hours to 1 day, she used to have migraines about couple times each month, but since May 2020, she began to have increased frequency of migraine, 2-3 times each week, sometimes her migraine preceded by loss of vision in the right visual field,  Previously she took Topamax as migraine prevention, which does help her migraine, she has a questionable history of kidney stone, she also complained side effect of Topamax weight gain, feeling fatigued,  For abortive treatment, previously she has tried different triptans, Imitrex, Maxalt, does not help her headache, she is not taking Fioricet as needed, which is helpful.  Update January 18, 2020 SS: When last seen, was started on Lamictal working up to 100 mg twice a day, tolerating well.  Her headaches are doing really well.  On average having 1 migraine a week, this is the " gold standard" for her.  She will take Fioricet with excellent benefit.  She is pleased how well her headaches are doing.  Has  history of anxiety, fibromyalgia, IBS, scoliosis.  She presents today unaccompanied.  Update November 11, 2020 SS: Here today via VV. No longer taking Lamictal, she thinks she doesn't need a daily preventative med. Had bad end of Oct/1st of Nov, horrible with allergies, relative died of COVID. Since middle of November-relatively migraine free, except for occasional migraine 1 every 2 weeks, does have 1 today. Will take 1/2 tablet Fioricet and Tizanidine, takes other half of Fioricet later if needed. This is best combo she has ever had. Is working out, has lost 10 lbs over last 3 months. Sleep study has shown OSA, will be started CPAP.    Observations/Objective: Via VV is alert and oriented, speech is clear and concise, facial symmetry noted, follows exam commands, gait is steady and intact  Assessment and Plan: 1.  Chronic migraine headache  -On average 1 migraine every 2 weeks -Has stopped Lamictal, does not desire preventative medications -For now, will remain off Lamictal, continue Fioricet, along with tizanidine as needed for acute headache, refills sent in -Follow-up in 1 year or sooner if needed, advised not to restart Lamictal abruptly  Follow Up Instructions: 1 year 11/11/2021 2:45   I discussed the assessment and treatment plan with the patient. The patient was provided an opportunity to ask questions and all were answered. The patient agreed with the plan and demonstrated an understanding of the instructions.   The patient was advised to call back or seek an in-person evaluation if the symptoms worsen or if the condition fails to improve as anticipated.  I spent 20 minutes of face-to-face and non-face-to-face time with patient.  This included previsit chart review,  lab review, study review, order entry, electronic health record documentation, patient education.  Otila Kluver, DNP  [facility] [address], [address] [place], [place] [address] [phone]

## 2020-11-13 DIAGNOSIS — H35453 Secondary pigmentary degeneration, bilateral: Secondary | ICD-10-CM | POA: Diagnosis not present

## 2020-11-13 DIAGNOSIS — H35363 Drusen (degenerative) of macula, bilateral: Secondary | ICD-10-CM | POA: Diagnosis not present

## 2020-11-13 DIAGNOSIS — H53412 Scotoma involving central area, left eye: Secondary | ICD-10-CM | POA: Diagnosis not present

## 2020-11-13 DIAGNOSIS — H353122 Nonexudative age-related macular degeneration, left eye, intermediate dry stage: Secondary | ICD-10-CM | POA: Diagnosis not present

## 2020-11-13 DIAGNOSIS — H35013 Changes in retinal vascular appearance, bilateral: Secondary | ICD-10-CM | POA: Diagnosis not present

## 2020-11-13 DIAGNOSIS — H353111 Nonexudative age-related macular degeneration, right eye, early dry stage: Secondary | ICD-10-CM | POA: Diagnosis not present

## 2020-11-13 DIAGNOSIS — Z961 Presence of intraocular lens: Secondary | ICD-10-CM | POA: Diagnosis not present

## 2021-02-05 DIAGNOSIS — E039 Hypothyroidism, unspecified: Secondary | ICD-10-CM | POA: Diagnosis not present

## 2021-02-05 DIAGNOSIS — E7800 Pure hypercholesterolemia, unspecified: Secondary | ICD-10-CM | POA: Diagnosis not present

## 2021-02-06 DIAGNOSIS — J4520 Mild intermittent asthma, uncomplicated: Secondary | ICD-10-CM | POA: Diagnosis not present

## 2021-02-06 DIAGNOSIS — E7800 Pure hypercholesterolemia, unspecified: Secondary | ICD-10-CM | POA: Diagnosis not present

## 2021-02-06 DIAGNOSIS — M8580 Other specified disorders of bone density and structure, unspecified site: Secondary | ICD-10-CM | POA: Diagnosis not present

## 2021-02-06 DIAGNOSIS — E039 Hypothyroidism, unspecified: Secondary | ICD-10-CM | POA: Diagnosis not present

## 2021-02-06 DIAGNOSIS — Z1211 Encounter for screening for malignant neoplasm of colon: Secondary | ICD-10-CM | POA: Diagnosis not present

## 2021-02-06 DIAGNOSIS — Z0000 Encounter for general adult medical examination without abnormal findings: Secondary | ICD-10-CM | POA: Diagnosis not present

## 2021-02-12 ENCOUNTER — Other Ambulatory Visit: Payer: Self-pay | Admitting: Family Medicine

## 2021-02-12 DIAGNOSIS — M8580 Other specified disorders of bone density and structure, unspecified site: Secondary | ICD-10-CM

## 2021-02-12 DIAGNOSIS — Z1231 Encounter for screening mammogram for malignant neoplasm of breast: Secondary | ICD-10-CM

## 2021-02-14 DIAGNOSIS — Z1211 Encounter for screening for malignant neoplasm of colon: Secondary | ICD-10-CM | POA: Diagnosis not present

## 2021-03-03 DIAGNOSIS — H04222 Epiphora due to insufficient drainage, left lacrimal gland: Secondary | ICD-10-CM | POA: Diagnosis not present

## 2021-03-21 NOTE — Telephone Encounter (Signed)
Called patient LMTCB. Upon patient request DME selection is [facility] Patient understands he will be contacted by [facility] to set up his cpap. Patient understands to call if [facility] does not contact him with new setup in a timely manner. Patient understands they will be called once confirmation has been received from [facility] that they have received their new machine to schedule 10 week follow up appointment.   [facility] notified of new cpap order  Please add to airview Patient was grateful for the call and thanked me

## 2021-04-07 ENCOUNTER — Telehealth: Payer: Self-pay | Admitting: *Deleted

## 2021-04-07 DIAGNOSIS — G4733 Obstructive sleep apnea (adult) (pediatric): Secondary | ICD-10-CM | POA: Diagnosis not present

## 2021-04-07 NOTE — Telephone Encounter (Signed)
Staff message sent to [facility] to schedule patient for a 90 day sleep compliance visit. She was set up on CPAP today.

## 2021-04-24 ENCOUNTER — Ambulatory Visit: Payer: Medicare Other

## 2021-05-08 DIAGNOSIS — G4733 Obstructive sleep apnea (adult) (pediatric): Secondary | ICD-10-CM | POA: Diagnosis not present

## 2021-05-09 ENCOUNTER — Ambulatory Visit
Admission: RE | Admit: 2021-05-09 | Discharge: 2021-05-09 | Disposition: A | Discharge status: Home / Self Care | Payer: Medicare Other | Source: Ambulatory Visit | Attending: Family Medicine | Admitting: Family Medicine

## 2021-05-09 ENCOUNTER — Other Ambulatory Visit: Payer: Self-pay

## 2021-05-09 DIAGNOSIS — Z1231 Encounter for screening mammogram for malignant neoplasm of breast: Secondary | ICD-10-CM | POA: Diagnosis not present

## 2021-06-08 DIAGNOSIS — G4733 Obstructive sleep apnea (adult) (pediatric): Secondary | ICD-10-CM | POA: Diagnosis not present

## 2021-07-02 ENCOUNTER — Ambulatory Visit: Payer: Medicare Other | Admitting: Cardiovascular Disease

## 2021-07-02 ENCOUNTER — Other Ambulatory Visit: Payer: Self-pay

## 2021-07-02 ENCOUNTER — Encounter: Payer: Self-pay | Admitting: Cardiovascular Disease

## 2021-07-02 VITALS — BP 122/78 | HR 70 | Ht 65.0 in | Wt 211.2 lb

## 2021-07-02 DIAGNOSIS — R002 Palpitations: Secondary | ICD-10-CM

## 2021-07-02 DIAGNOSIS — R0683 Snoring: Secondary | ICD-10-CM | POA: Diagnosis not present

## 2021-07-02 DIAGNOSIS — G4733 Obstructive sleep apnea (adult) (pediatric): Secondary | ICD-10-CM | POA: Diagnosis not present

## 2021-07-02 DIAGNOSIS — R5382 Chronic fatigue, unspecified: Secondary | ICD-10-CM | POA: Diagnosis not present

## 2021-07-02 DIAGNOSIS — K219 Gastro-esophageal reflux disease without esophagitis: Secondary | ICD-10-CM

## 2021-07-02 DIAGNOSIS — E669 Obesity, unspecified: Secondary | ICD-10-CM | POA: Diagnosis not present

## 2021-07-02 NOTE — Progress Notes (Signed)
Cardiology Office Note    Date:  07/09/2021   ID:  [name], DOB 1951/08/10, MRN [id number]  PCP:  Shon Hale, MD  Cardiologist:  Nicki Guadalajara, MD (sleep); Dr. Lethea Killings  New sleep evaluation   History of Present Illness:  Stephanie Finley is a 70 y.o. female who is followed by Dr. Lethea Killings from a cardiology perspective.  She has a history of fibromyalgia follows palpitations.  Often, her palpitations occur while sleeping.  She has a history of snoring and has had frequent daily fatigue which had coincided with a diagnosis of hypothyroidism.  Her primary care physician is Dr. [name].  She has a remote tobacco history of smoking for 30 years but fortunately quit approximately 5 years ago.  Due to concerns for obstructive sleep apnea, she was referred for an initial home sleep test which was done on July 20, 2020.  On this study, she was felt to have mild overall sleep apnea with an AHI of 6.3.  However she had severe oxygen desaturation to a nadir of 79% raising concern for more significant sleep apnea particularly during REM sleep which could not be assessed on this home study.  He subsequently was referred for an in lab CPAP titration which was done on September 10, 2020.  CPAP was initiated at 5 cm and was titrated to 14 cm water pressure.  Her AHI at 12 cm was 0.6 with only 30 seconds of REM sleep and at 14 cm was 0 with O2 nadir 90% and 11 minutes and 48 seconds of REM sleep.  Unfortunately, due to supply chain issues, there was a significant weight prior to obtaining a new machine.  Finally, on April 07, 2021 she received a new ResMed air sense 11 AutoSet unit with initial settings at a minimum pressure of 12 up to 18 cm of water.  Her initial ramp time was 45 minutes with a ramp start at 4.  Typically she has been going to bed between 11 and 1130 and wakes up between 9 and 9:30 AM.  A download was obtained from June 02, 2021 through July 01, 2021.  She is  meeting compliance standards although did miss several days with usage at 80%.  Average use on days used was 6 hours and 6 minutes.  Her 95th percentile pressure was 14.8 with a maximum average pressure of 15.6.  AHI was 2.9/h.  Since initiating CPAP, she has felt improved.  Previously she had nocturia 4 times per night, she often will wake up at 3:00 and may not go right back to bed be up drinking fluids and taking medication.  She has had issues with TMJ discomfort.  [facility] [employer] is her DME company.  An Epworth Sleepiness Scale score was calculated in the office today and this endorsed at 3 arguing against residual daytime sleepiness.  Since initiating CPAP therapy, she has noticed significant improvement in previous GERD symptoms as well as previous 3-4 time nocturia per evening. Her nocturnal palpitations have improved.  She denies any bruxism, restless legs, sleep paralysis, hypnagogic hallucinations or cataplectic events.  She presents for her initial sleep evaluation.  Current medications include Fioricet for migraine headaches as needed, Prozac for anxiety, levothyroxine for hypothyroidism, and Symbicort and as needed albuterol for asthma:,   Past Medical History:  Diagnosis Date   Fibromyalgia    Headache    Migraines    Osteopenia    Scoliosis     Past Surgical History:  Procedure Laterality Date   CYST EXCISION     1986   LAPAROTOMY     x 3    Current Medications: Outpatient Medications Prior to Visit  Medication Sig Dispense Refill   albuterol (VENTOLIN HFA) 108 (90 Base) MCG/ACT inhaler Inhale 2 puffs into the lungs every 4 (four) hours as needed.      butalbital-acetaminophen-caffeine (FIORICET) 50-325-40 MG tablet TAKE 1 TABLET BY MOUTH EVERY 6 HOURS AS NEEDED FOR HEADACHE 12 tablet 3   calcium carbonate (TUMS - DOSED IN MG ELEMENTAL CALCIUM) 500 MG chewable tablet Chew 1 tablet by mouth as needed for indigestion or heartburn.     FLUoxetine (PROZAC) 20 MG capsule  Take 20 mg by mouth daily.     fluticasone (FLONASE) 50 MCG/ACT nasal spray Place 2 sprays into both nostrils as needed.      levothyroxine (SYNTHROID) 50 MCG tablet Take 50 mcg by mouth every morning.     omeprazole (PRILOSEC) 10 MG capsule Take 10 mg by mouth daily.     SYMBICORT 160-4.5 MCG/ACT inhaler Inhale 2 puffs into the lungs daily.      tiZANidine (ZANAFLEX) 4 MG tablet Take 0.5 tablets (2 mg total) by mouth every 6 (six) hours as needed for muscle spasms. 30 tablet 0   ondansetron (ZOFRAN ODT) 4 MG disintegrating tablet Take 1 tablet (4 mg total) by mouth every 8 (eight) hours as needed. 20 tablet 6   No facility-administered medications prior to visit.     Allergies:   Penicillins   Social History   Socioeconomic History   Marital status: Divorced    Spouse name: Not on file   Number of children: 0   Years of education: college   Highest education level: Bachelor's degree (e.g., BA, AB, BS)  Occupational History   Occupation: Retired  Tobacco Use   Smoking status: Former    Packs/day: 1.00    Years: 30.00    Pack years: 30.00    Types: Cigarettes    Quit date: 09/24/2015    Years since quitting: 5.7   Smokeless tobacco: Never  Substance and Sexual Activity   Alcohol use: No    Alcohol/week: 0.0 standard drinks   Drug use: No   Sexual activity: Not on file  Other Topics Concern   Not on file  Social History Narrative   Patient lives at home alone w/cat.   Patient is retired and has her B.S.    Caffeine - two cups daily.   Patient is divorced.    Patient has a college education.    Patient has no children.       Social Determinants of Health   Financial Resource Strain: Not on file  Food Insecurity: Not on file  Transportation Needs: Not on file  Physical Activity: Not on file  Stress: Not on file  Social Connections: Not on file    Socially she is divorced.  There is a previous 30-year tobacco history, fortunately she quit approximately 5 years  ago.   Family History:  The patient's family history includes Brain cancer in her father; Heart attack in her paternal uncle; Ovarian cancer in her mother; Rheum arthritis in her sister.  Both parents are deceased, father had a glioblastoma and mother had ovarian cancer.  She has 1 deceased sister.  ROS General: Negative; No fevers, chills, or night sweats;  HEENT: Negative; No changes in vision or hearing, sinus congestion, difficulty swallowing Pulmonary: Asthma Cardiovascular:  palpitations GI: GERD GU: Negative;  No dysuria, hematuria, or difficulty voiding Musculoskeletal: Negative; no myalgias, joint pain, or weakness Hematologic/Oncology: Negative; no easy bruising, bleeding Endocrine: Negative; no heat/cold intolerance; no diabetes Neuro: Negative; no changes in balance, headaches Skin: Negative; No rashes or skin lesions Psychiatric: Negative; No behavioral problems, depression Sleep: See HPI Other comprehensive 14 point system review is negative.   PHYSICAL EXAM:   VS:  BP 122/78   Pulse 70   Ht 5\' 5"  (1.651 m)   Wt 211 lb 3.2 oz (95.8 kg)   SpO2 96%   BMI 35.15 kg/m    Wt Readings from Last 3 Encounters:  07/02/21 211 lb 3.2 oz (95.8 kg)  09/10/20 234 lb (106.1 kg)  06/06/20 234 lb (106.1 kg)    General: Alert, oriented, no distress.  Skin: normal turgor, no rashes, warm and dry HEENT: Normocephalic, atraumatic. Pupils equal round and reactive to light; sclera anicteric; extraocular muscles intact;  Nose without nasal septal hypertrophy Mouth/Parynx benign; Mallinpatti scale 3 Neck: No JVD, no carotid bruits; normal carotid upstroke Lungs: clear to ausculatation and percussion; no wheezing or rales Chest wall: without tenderness to palpitation Heart: PMI not displaced, RRR, s1 s2 normal, 1/6 systolic murmur, no diastolic murmur, no rubs, gallops, thrills, or heaves Abdomen: soft, nontender; no hepatosplenomehaly, BS+; abdominal aorta nontender and not dilated by  palpation. Back: no CVA tenderness Pulses 2+ Musculoskeletal: full range of motion, normal strength, no joint deformities Extremities: no clubbing cyanosis or edema, Homan's sign negative  Neurologic: grossly nonfocal; Cranial nerves grossly wnl Psychologic: Normal mood and affect   Studies/Labs Reviewed:   EKG:  EKG is ordered today.  The ekg ordered October 5,2022  demonstrates NSR at 70, LAE, low voltage, Q III, aVF., no ectopy  Recent Labs: BMP Latest Ref Rng & Units 07/04/2010  Glucose 70 - 99 mg/dL 95  BUN 6 - 23 mg/dL 19  Creatinine 0.4 - 1.2 mg/dL 1.1  Sodium 09/03/2010 - 409 mEq/L 145  Potassium 3.5 - 5.1 mEq/L 3.8  Chloride 96 - 112 mEq/L 119(H)     No flowsheet data found.  CBC Latest Ref Rng & Units 07/04/2010 07/04/2010  WBC 4.0 - 10.5 K/uL - 4.0  Hemoglobin 12.0 - 15.0 g/dL 09/03/2010 91.4  Hematocrit 78.2 - 46.0 % 41.0 39.4  Platelets 150 - 400 K/uL - 144(L)   Lab Results  Component Value Date   MCV 95.0 07/04/2010   No results found for: TSH No results found for: HGBA1C   BNP No results found for: BNP  ProBNP No results found for: PROBNP   Lipid Panel  No results found for: CHOL, TRIG, HDL, CHOLHDL, VLDL, LDLCALC, LDLDIRECT, LABVLDL   RADIOLOGY: No results found.   Additional studies/ records that were reviewed today include:     Patient Name: Delories, Mauri Date: 07/20/2020 Gender: Female D.O.B: 29-Sep-1950 Age (years): 69 Referring Provider: 70 ONeal Height (inches): 64 Interpreting Physician: Ronnald Ramp MD, ABSM Weight (lbs): 237 RPSGT: Nicki Guadalajara BMI: 41 MRN: [place] Sink Neck Size: 15.00   CLINICAL INFORMATION Sleep Study Type: HST   Indication for sleep study: snoring, fatigue   Epworth Sleepiness Score: 3   SLEEP STUDY TECHNIQUE A multi-channel overnight portable sleep study was performed. The channels recorded were: nasal airflow, thoracic respiratory movement, and oxygen saturation with a pulse oximetry.  Snoring was also monitored.   MEDICATIONS albuterol (VENTOLIN HFA) 108 (90 Base) MCG/ACT inhaler butalbital-acetaminophen-caffeine (FIORICET) 50-325-40 MG tablet calcium carbonate (TUMS - DOSED IN MG ELEMENTAL CALCIUM) 500 MG  chewable tablet FLUoxetine (PROZAC) 20 MG capsule fluticasone (FLONASE) 50 MCG/ACT nasal spray levothyroxine (SYNTHROID) 50 MCG tablet omeprazole (PRILOSEC) 10 MG capsule ondansetron (ZOFRAN ODT) 4 MG disintegrating tablet SYMBICORT 160-4.5 MCG/ACT inhaler Patient self administered medications include: N/A.   SLEEP ARCHITECTURE Patient was studied for 388.5 minutes. The sleep efficiency was 100.0 % and the patient was supine for 77.5%. The arousal index was 0.0 per hour.   RESPIRATORY PARAMETERS The overall AHI was 6.3 per hour, with a central apnea index of 0.0 per hour.   The oxygen nadir was 79% during sleep. Time spent <89% was 121 minutes.   CARDIAC DATA Mean heart rate during sleep was 76.4 bpm.   IMPRESSIONS - Mild obstructive sleep apnea occurred during this study (AHI 6.3/h). The severity during REM sleep cannot be assessed on this home study. - No significant central sleep apnea occurred during this study (CAI = 0.0/h). - Severe oxygen desaturation to a nadir of 79%. - Patient snored 0.1% during the sleep.   DIAGNOSIS - Obstructive Sleep Apnea (G47.33) - Nocturnal Hypoxemia (G47.36)   RECOMMENDATIONS - Therapeutic CPAP titration to determine optimal pressure required to alleviate sleep disordered breathing. If unable to schedule for an in-lab titration, initiate Auto-PAP at 6-15 cm of water. - Effort should be made to optimize nasal and oropharyngeal patency. - Alterative to CPAP such as a customized oral appliance caan be considered. - Avoid alcohol, sedatives and other CNS depressants that may worsen sleep apnea and disrupt normal sleep architecture. - Sleep hygiene should be reviewed to assess factors that may improve sleep quality. - Weight  management (BMI 41) and regular exercise should be initiated or continued. - Return to Sleep Center to discuss the results of this study      CPAP TITRATION: 09/10/2020 CLINICAL INFORMATION The patient is referred for a CPAP titration to treat sleep apnea.   Date of HST: 10/23/20211: AHI 6.3/h, O2 nadir 79%.   SLEEP STUDY TECHNIQUE As per the AASM Manual for the Scoring of Sleep and Associated Events v2.3 (April 2016) with a hypopnea requiring 4% desaturations.   The channels recorded and monitored were frontal, central and occipital EEG, electrooculogram (EOG), submentalis EMG (chin), nasal and oral airflow, thoracic and abdominal wall motion, anterior tibialis EMG, snore microphone, electrocardiogram, and pulse oximetry. Continuous positive airway pressure (CPAP) was initiated at the beginning of the study and titrated to treat sleep-disordered breathing.   MEDICATIONS albuterol (VENTOLIN HFA) 108 (90 Base) MCG/ACT inhaler butalbital-acetaminophen-caffeine (FIORICET) 50-325-40 MG tablet calcium carbonate (TUMS - DOSED IN MG ELEMENTAL CALCIUM) 500 MG chewable tablet FLUoxetine (PROZAC) 20 MG capsule fluticasone (FLONASE) 50 MCG/ACT nasal spray levothyroxine (SYNTHROID) 50 MCG tablet omeprazole (PRILOSEC) 10 MG capsule ondansetron (ZOFRAN ODT) 4 MG disintegrating tablet SYMBICORT 160-4.5 MCG/ACT inhaler Medications self-administered by patient taken the night of the study : N/A   TECHNICIAN COMMENTS Comments added by technician: None Comments added by scorer: N/A   RESPIRATORY PARAMETERS Optimal PAP Pressure (cm):  14        AHI at Optimal Pressure (/hr):            0.0 Overall Minimal O2 (%):         86.0     Supine % at Optimal Pressure (%):    100 Minimal O2 at Optimal Pressure (%): 90.0        SLEEP ARCHITECTURE The study was initiated at 10:46:14 PM and ended at 4:53:34 AM.   Sleep onset time was 27.8 minutes and the sleep  efficiency was 70.4%%. The total sleep time was  258.5 minutes.   The patient spent 8.1%% of the night in stage N1 sleep, 72.5%% in stage N2 sleep, 8.5%% in stage N3 and 10.8% in REM.Stage REM latency was 134.0 minutes   Wake after sleep onset was 81.1. Alpha intrusion was absent. Supine sleep was 100.00%.   CARDIAC DATA The 2 lead EKG demonstrated sinus rhythm. The mean heart rate was 70.4 beats per minute. Other EKG findings include: None.   LEG MOVEMENT DATA The total Periodic Limb Movements of Sleep (PLMS) were 0. The PLMS index was 0.0. A PLMS index of <15 is considered normal in adults.   IMPRESSIONS - CPAP was initiated at 5 cm and was titrated to optimal PAP pressure 14 cm of water. AHI at 12 cm was 0.6/h with only 30 seconds of REM sleep and at 14 cm was 0 and O2 nadir at 90% with 11:48 minutes of REM sleep. - Central sleep apnea was not noted during this titration (CAI = 0.0/h). - Moderate oxygen desaturations were observed during this titration to a nadir of 86% at 9 cm of water. - No snoring was audible during this study. - No cardiac abnormalities were observed during this study. - Clinically significant periodic limb movements were not noted during this study. Arousals associated with PLMs were rare.   DIAGNOSIS - Obstructive Sleep Apnea (G47.33)   RECOMMENDATIONS - Recommend an initial trial of CPAP Auto therapy with EPR of 3 at 12 - 18 cm H2O with heated humidification. A Medium size Fisher&Paykel Full Face Mask Simplus mask was used for the titration. - Effort should be made to optimize nasal and oropharyngeal patency. - Avoid alcohol, sedatives and other CNS depressants that may worsen sleep apnea and disrupt normal sleep architecture. - Sleep hygiene should be reviewed to assess factors that may improve sleep quality. - Weight management and regular exercise should be initiated or continued. - Recommend a download in 30 days and sleep clinic evaluation after 4 weeks of therapy.     ASSESSMENT:    1. OSA  (obstructive sleep apnea)   2. Snoring   3. Chronic fatigue   4. Obesity, Class II, BMI 35-39.9   5. Palpitations   6. Gastroesophageal reflux disease, unspecified whether esophagitis present     PLAN:  Ms. Stephanie Finley is a very pleasant 70 year old female who has a history of fibromyalgia, asthma, GERD, hypothyroidism, as well as recent development of nocturnal palpitations.  She was found to have at least overall mild sleep apnea on her home study but during that study she had significant oxygen desaturation to 79% raising concern for much more severe sleep apnea during REM sleep which could not be assessed on the home study.  She subsequently underwent a CPAP titration study and was titrated up to an optimal pressure of 14 cm of water with REM sleep present.  Unfortunately, due to supply chain issues there was a significant delay in her receiving a new machine but she did receive the new ResMed air sense 11 CPAP auto unit with set up date on June 08, 2021.  I had a lengthy discussion with her today and reviewed reviewed in detail normal sleep architecture and the potential adverse consequences of untreated sleep apnea.  I discussed the potential increased sympathetic tone nocturnally associated with untreated sleep apnea as a particular potential factor in nocturnal palpitations which may also be exacerbated by significant nocturnal hypoxemia.  I discussed its effect on blood  pressure with potential elimination of the normal 20 mm diastolic dip typically observed with normal sleep.  I also discussed its potential effects on potential nocturnal ischemia both cardiac and cerebrovascular, increase incidence of atrial fibrillation and also its effects on glucose metabolism, inflammation, and GERD.  Since initiating CPAP therapy, she feels improved.  Her previous nocturia of 3-4 times per night has improved to approximately 1-2 times per night.  Her GERD has also improved.  She no longer is having the  palpitations that she had experienced previously.  I am changing modifications to her settings and will change her ramp time from 45 minutes down to 30 minutes and increase her ramp start at 6 cm of water.  At present we will continue with her pressure range of 12 to 18 cm of water.  I discussed the importance of optimal sleep duration being 7 to 9 hours.  On her most recent download even though she was allowing total sleep time to be greater oftentimes she would still wake up at 3 AM and get out of bed for some time before going back to bed.  BMI is consistent with obesity at 35.15.  I have recommended weight loss and increased exercise.  She is using a ResMed AirFit F 30 little I mask and is doing well with this.  Her blood pressure is stable.  She is on fluoxetine for anxiety and takes Fioricet on a as needed basis for migraine headaches.  She currently is on levothyroxine 50 mcg for hypothyroidism and takes Symbicort 2 puffs daily and has as needed albuterol for asthma.  I will see her in 1 year for follow-up evaluation or sooner as needed   Medication Adjustments/Labs and Tests Ordered: Current medicines are reviewed at length with the patient today.  Concerns regarding medicines are outlined above.  Medication changes, Labs and Tests ordered today are listed in the Patient Instructions below. Patient Instructions  Medication Instructions:  Your physician recommends that you continue on your current medications as directed. Please refer to the Current Medication list given to you today.  *If you need a refill on your cardiac medications before your next appointment, please call your pharmacy*  Follow-Up: At [facility], you and your health needs are our priority.  As part of our continuing mission to provide you with exceptional heart care, we have created designated Provider Care Teams.  These Care Teams include your primary Cardiologist (physician) and Advanced Practice Providers (APPs -   Physician Assistants and Nurse Practitioners) who all work together to provide you with the care you need, when you need it.  We recommend signing up for the patient portal called "MyChart".  Sign up information is provided on this After Visit Summary.  MyChart is used to connect with patients for Virtual Visits (Telemedicine).  Patients are able to view lab/test results, encounter notes, upcoming appointments, etc.  Non-urgent messages can be sent to your provider as well.   To learn more about what you can do with MyChart, go to [email or web address].    Your next appointment:   12 month(s)  The format for your next appointment:   In Person  Provider:   Nicki Guadalajara, MD      Signed, Nicki Guadalajara, MD  07/09/2021 7:24 PM    [facility] [address], [place], [place]  [address] Phone: [phone]

## 2021-07-02 NOTE — Patient Instructions (Signed)

## 2021-07-08 DIAGNOSIS — G4733 Obstructive sleep apnea (adult) (pediatric): Secondary | ICD-10-CM | POA: Diagnosis not present

## 2021-07-08 DIAGNOSIS — Z23 Encounter for immunization: Secondary | ICD-10-CM | POA: Diagnosis not present

## 2021-07-09 ENCOUNTER — Encounter: Payer: Self-pay | Admitting: Cardiovascular Disease

## 2021-07-23 DIAGNOSIS — J069 Acute upper respiratory infection, unspecified: Secondary | ICD-10-CM | POA: Diagnosis not present

## 2021-07-23 DIAGNOSIS — Z03818 Encounter for observation for suspected exposure to other biological agents ruled out: Secondary | ICD-10-CM | POA: Diagnosis not present

## 2021-07-23 DIAGNOSIS — J209 Acute bronchitis, unspecified: Secondary | ICD-10-CM | POA: Diagnosis not present

## 2021-07-23 DIAGNOSIS — R5383 Other fatigue: Secondary | ICD-10-CM | POA: Diagnosis not present

## 2021-08-06 DIAGNOSIS — G4733 Obstructive sleep apnea (adult) (pediatric): Secondary | ICD-10-CM | POA: Diagnosis not present

## 2021-08-08 DIAGNOSIS — G4733 Obstructive sleep apnea (adult) (pediatric): Secondary | ICD-10-CM | POA: Diagnosis not present

## 2021-08-11 DIAGNOSIS — Z961 Presence of intraocular lens: Secondary | ICD-10-CM | POA: Diagnosis not present

## 2021-08-11 DIAGNOSIS — H353123 Nonexudative age-related macular degeneration, left eye, advanced atrophic without subfoveal involvement: Secondary | ICD-10-CM | POA: Diagnosis not present

## 2021-08-11 DIAGNOSIS — H524 Presbyopia: Secondary | ICD-10-CM | POA: Diagnosis not present

## 2021-08-11 DIAGNOSIS — H353111 Nonexudative age-related macular degeneration, right eye, early dry stage: Secondary | ICD-10-CM | POA: Diagnosis not present

## 2021-08-14 ENCOUNTER — Other Ambulatory Visit: Payer: Self-pay

## 2021-08-14 ENCOUNTER — Ambulatory Visit
Admission: RE | Admit: 2021-08-14 | Discharge: 2021-08-14 | Disposition: A | Discharge status: Home / Self Care | Payer: Medicare Other | Source: Ambulatory Visit | Attending: Family Medicine | Admitting: Family Medicine

## 2021-08-14 DIAGNOSIS — Z780 Asymptomatic menopausal state: Secondary | ICD-10-CM | POA: Diagnosis not present

## 2021-08-14 DIAGNOSIS — M810 Age-related osteoporosis without current pathological fracture: Secondary | ICD-10-CM | POA: Diagnosis not present

## 2021-08-14 DIAGNOSIS — M85852 Other specified disorders of bone density and structure, left thigh: Secondary | ICD-10-CM | POA: Diagnosis not present

## 2021-08-14 DIAGNOSIS — M8580 Other specified disorders of bone density and structure, unspecified site: Secondary | ICD-10-CM

## 2021-09-07 DIAGNOSIS — G4733 Obstructive sleep apnea (adult) (pediatric): Secondary | ICD-10-CM | POA: Diagnosis not present

## 2021-09-09 DIAGNOSIS — M810 Age-related osteoporosis without current pathological fracture: Secondary | ICD-10-CM | POA: Diagnosis not present

## 2021-09-09 DIAGNOSIS — R030 Elevated blood-pressure reading, without diagnosis of hypertension: Secondary | ICD-10-CM | POA: Diagnosis not present

## 2021-09-09 DIAGNOSIS — R634 Abnormal weight loss: Secondary | ICD-10-CM | POA: Diagnosis not present

## 2021-09-16 DIAGNOSIS — N3000 Acute cystitis without hematuria: Secondary | ICD-10-CM | POA: Diagnosis not present

## 2021-10-08 DIAGNOSIS — G4733 Obstructive sleep apnea (adult) (pediatric): Secondary | ICD-10-CM | POA: Diagnosis not present

## 2021-10-20 ENCOUNTER — Other Ambulatory Visit: Payer: Self-pay

## 2021-10-20 DIAGNOSIS — G43709 Chronic migraine without aura, not intractable, without status migrainosus: Secondary | ICD-10-CM

## 2021-10-20 MED ORDER — TIZANIDINE HCL 4 MG PO TABS: 2.0000 mg | ORAL_TABLET | Freq: Four times a day (QID) | ORAL | 0 refills | Status: DC | PRN

## 2021-11-06 DIAGNOSIS — G4733 Obstructive sleep apnea (adult) (pediatric): Secondary | ICD-10-CM | POA: Diagnosis not present

## 2021-11-08 DIAGNOSIS — G4733 Obstructive sleep apnea (adult) (pediatric): Secondary | ICD-10-CM | POA: Diagnosis not present

## 2021-11-11 ENCOUNTER — Ambulatory Visit: Payer: Medicare Other | Admitting: Neurology

## 2021-11-11 ENCOUNTER — Encounter: Payer: Self-pay | Admitting: Neurology

## 2021-11-11 DIAGNOSIS — G43709 Chronic migraine without aura, not intractable, without status migrainosus: Secondary | ICD-10-CM | POA: Diagnosis not present

## 2021-11-11 MED ORDER — TIZANIDINE HCL 4 MG PO TABS: 2.0000 mg | ORAL_TABLET | Freq: Four times a day (QID) | ORAL | 0 refills | Status: DC | PRN

## 2021-11-11 MED ORDER — BUTALBITAL-APAP-CAFFEINE 50-325-40 MG PO TABS: ORAL_TABLET | ORAL | 3 refills | Status: DC

## 2021-11-11 NOTE — Progress Notes (Signed)
PATIENT: Stephanie Finley DOB: 01-27-1951  REASON FOR VISIT: Follow up for migraine HISTORY FROM: Patient PRIMARY NEUROLOGIST: Dr. [name]  HISTORY  Stephanie Finley is a 71 year old female, seen in request by her primary care physician Dr. [name] for evaluation of chronic migraine, initial evaluation was on July 26, 2019.   I have reviewed and summarized the referring note from the referring physician.  She had past medical history of chronic migraine since childhood, her typical migraine lateralized retro-orbital area severe pounding headache with associated light, noise sensitivity, nauseous, lasting for few hours to 1 day, she used to have migraines about couple times each month, but since May 2020, she began to have increased frequency of migraine, 2-3 times each week, sometimes her migraine preceded by loss of vision in the right visual field,   Previously she took Topamax as migraine prevention, which does help her migraine, she has a questionable history of kidney stone, she also complained side effect of Topamax weight gain, feeling fatigued,   For abortive treatment, previously she has tried different triptans, Imitrex, Maxalt, does not help her headache, she is not taking Fioricet as needed, which is helpful.   Update January 18, 2020 SS: When last seen, was started on Lamictal working up to 100 mg twice a day, tolerating well.  Her headaches are doing really well.  On average having 1 migraine a week, this is the " gold standard" for her.  She will take Fioricet with excellent benefit.  She is pleased how well her headaches are doing.  Has history of anxiety, fibromyalgia, IBS, scoliosis.  She presents today unaccompanied.   Update November 11, 2020 SS: Here today via VV. No longer taking Lamictal, she thinks she doesn't need a daily preventative med. Had bad end of Oct/1st of Nov, horrible with allergies, relative died of COVID. Since middle of November-relatively migraine  free, except for occasional migraine 1 every 2 weeks, does have 1 today. Will take 1/2 tablet Fioricet and Tizanidine, takes other half of Fioricet later if needed. This is best combo she has ever had. Is working out, has lost 10 lbs over last 3 months. Sleep study has shown OSA, will be started CPAP.   Update November 11, 2021 SS: Here today alone, had bad month in December/January, that was a cluster, related to holidays/weather/UTI/bronchitis. Getting used to CPAP. Takes Fioricet (1/2 tablet), tizanidine as combo for headaches. Of recent having to take 2-3 tablets a week. Still doesn't want a preventative, in past Topamax worked best for her.   REVIEW OF SYSTEMS: Out of a complete 14 system review of symptoms, the patient complains only of the following symptoms, and all other reviewed systems are negative.  See HPI  ALLERGIES: Allergies  Allergen Reactions   Penicillins Hives    HOME MEDICATIONS: Outpatient Medications Prior to Visit  Medication Sig Dispense Refill   albuterol (VENTOLIN HFA) 108 (90 Base) MCG/ACT inhaler Inhale 2 puffs into the lungs every 4 (four) hours as needed.      butalbital-acetaminophen-caffeine (FIORICET) 50-325-40 MG tablet TAKE 1 TABLET BY MOUTH EVERY 6 HOURS AS NEEDED FOR HEADACHE 12 tablet 3   calcium carbonate (TUMS - DOSED IN MG ELEMENTAL CALCIUM) 500 MG chewable tablet Chew 1 tablet by mouth as needed for indigestion or heartburn.     FLUoxetine (PROZAC) 20 MG capsule Take 20 mg by mouth daily.     fluticasone (FLONASE) 50 MCG/ACT nasal spray Place 2 sprays into both nostrils as needed.  levothyroxine (SYNTHROID) 50 MCG tablet Take 50 mcg by mouth every morning.     omeprazole (PRILOSEC) 10 MG capsule Take 10 mg by mouth daily.     SYMBICORT 160-4.5 MCG/ACT inhaler Inhale 2 puffs into the lungs daily.      tiZANidine (ZANAFLEX) 4 MG tablet Take 0.5 tablets (2 mg total) by mouth every 6 (six) hours as needed for muscle spasms. 30 tablet 0   No  facility-administered medications prior to visit.    PAST MEDICAL HISTORY: Past Medical History:  Diagnosis Date   Fibromyalgia    Headache    Migraines    Osteopenia    Scoliosis     PAST SURGICAL HISTORY: Past Surgical History:  Procedure Laterality Date   CYST EXCISION     1986   LAPAROTOMY     x 3    FAMILY HISTORY: Family History  Problem Relation Age of Onset   Ovarian cancer Mother    Brain cancer Father    Rheum arthritis Sister    Heart attack Paternal Uncle     SOCIAL HISTORY: Social History   Socioeconomic History   Marital status: Divorced    Spouse name: Not on file   Number of children: 0   Years of education: college   Highest education level: Bachelor's degree (e.g., BA, AB, BS)  Occupational History   Occupation: Retired  Tobacco Use   Smoking status: Former    Packs/day: 1.00    Years: 30.00    Pack years: 30.00    Types: Cigarettes    Quit date: 09/24/2015    Years since quitting: 6.1   Smokeless tobacco: Never  Substance and Sexual Activity   Alcohol use: No    Alcohol/week: 0.0 standard drinks   Drug use: No   Sexual activity: Not on file  Other Topics Concern   Not on file  Social History Narrative   Patient lives at home alone w/cat.   Patient is retired and has her B.S.    Caffeine - two cups daily.   Patient is divorced.    Patient has a college education.    Patient has no children.       Social Determinants of Health   Financial Resource Strain: Not on file  Food Insecurity: Not on file  Transportation Needs: Not on file  Physical Activity: Not on file  Stress: Not on file  Social Connections: Not on file  Intimate Partner Violence: Not on file   PHYSICAL EXAM  Vitals:   11/11/21 1429  BP: (!) 141/85  Pulse: 79  Weight: 202 lb (91.6 kg)  Height: 5\' 5"  (1.651 m)   Body mass index is 33.61 kg/m.  Generalized: Well developed, in no acute distress   Neurological examination  Mentation: Alert oriented to  time, place, history taking. Follows all commands speech and language fluent Cranial nerve II-XII: Pupils were equal round reactive to light. Extraocular movements were full, visual field were full on confrontational test. Facial sensation and strength were normal.  Head turning and shoulder shrug  were normal and symmetric. Motor: The motor testing reveals 5 over 5 strength of all 4 extremities. Good symmetric motor tone is noted throughout.  Sensory: Sensory testing is intact to soft touch on all 4 extremities. No evidence of extinction is noted.  Coordination: Cerebellar testing reveals good finger-nose-finger and heel-to-shin bilaterally.  Gait and station: Gait is normal.  Reflexes: Deep tendon reflexes are symmetric and normal bilaterally.   DIAGNOSTIC DATA (LABS, IMAGING,  TESTING) - I reviewed patient records, labs, notes, testing and imaging myself where available.  Lab Results  Component Value Date   WBC 4.0 07/04/2010   HGB 13.9 07/04/2010   HCT 41.0 07/04/2010   MCV 95.0 07/04/2010   PLT 144 (L) 07/04/2010      Component Value Date/Time   NA 145 07/04/2010 1722   K 3.8 07/04/2010 1722   CL 119 (H) 07/04/2010 1722   GLUCOSE 95 07/04/2010 1722   BUN 19 07/04/2010 1722   CREATININE 1.1 07/04/2010 1722   No results found for: CHOL, HDL, LDLCALC, LDLDIRECT, TRIG, CHOLHDL No results found for: INOM7E No results found for: VITAMINB12 No results found for: TSH  ASSESSMENT AND PLAN 71 y.o. year old female   1.  Chronic migraine headache  -Few headache clusters lately related to weather/stress -Will continue Fioricet, tizanidine as needed for acute headache, we talked about not overusing Fioricet  -If headaches increase would restart Topamax, has felt this worked the best for her in the past  -Follow-up in 1 year or sooner if needed  Otila Kluver, DNP 11/11/2021, 2:59 PM [facility] [address], [address] [place], [place] [address] [phone]

## 2021-12-06 DIAGNOSIS — G4733 Obstructive sleep apnea (adult) (pediatric): Secondary | ICD-10-CM | POA: Diagnosis not present

## 2022-01-01 DIAGNOSIS — H04322 Acute dacryocystitis of left lacrimal passage: Secondary | ICD-10-CM | POA: Diagnosis not present

## 2022-01-01 DIAGNOSIS — H01002 Unspecified blepharitis right lower eyelid: Secondary | ICD-10-CM | POA: Diagnosis not present

## 2022-01-06 DIAGNOSIS — G4733 Obstructive sleep apnea (adult) (pediatric): Secondary | ICD-10-CM | POA: Diagnosis not present

## 2022-02-03 DIAGNOSIS — G4733 Obstructive sleep apnea (adult) (pediatric): Secondary | ICD-10-CM | POA: Diagnosis not present

## 2022-03-17 DIAGNOSIS — R002 Palpitations: Secondary | ICD-10-CM | POA: Diagnosis not present

## 2022-03-17 DIAGNOSIS — E039 Hypothyroidism, unspecified: Secondary | ICD-10-CM | POA: Diagnosis not present

## 2022-03-17 DIAGNOSIS — E7800 Pure hypercholesterolemia, unspecified: Secondary | ICD-10-CM | POA: Diagnosis not present

## 2022-04-14 IMAGING — MG MM DIGITAL SCREENING BILAT W/ TOMO AND CAD
8 series · 8 of 24 positions shown · non-contrast
Comparison: Previous exam(s).

ACR Breast Density Category a: The breast tissue is almost entirely
fatty.

CLINICAL DATA: Screening.

EXAM:
DIGITAL SCREENING BILATERAL MAMMOGRAM WITH TOMOSYNTHESIS AND CAD
TECHNIQUE: Bilateral screening digital craniocaudal and mediolateral oblique
mammograms were obtained. Bilateral screening digital breast
tomosynthesis was performed. The images were evaluated with
computer-aided detection.

[L MLO synth-2D]
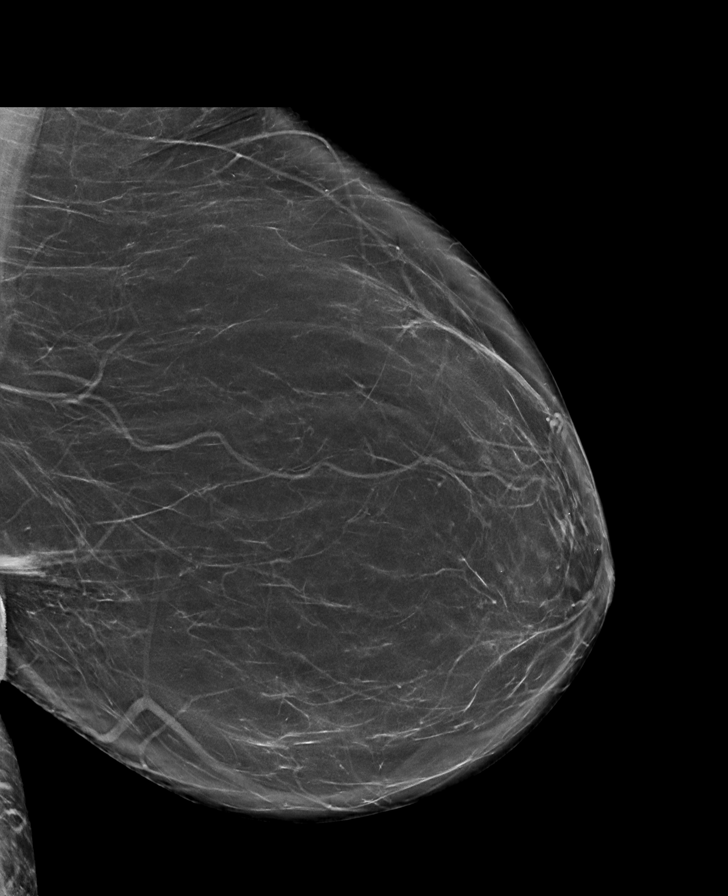

[R MLO synth-2D]
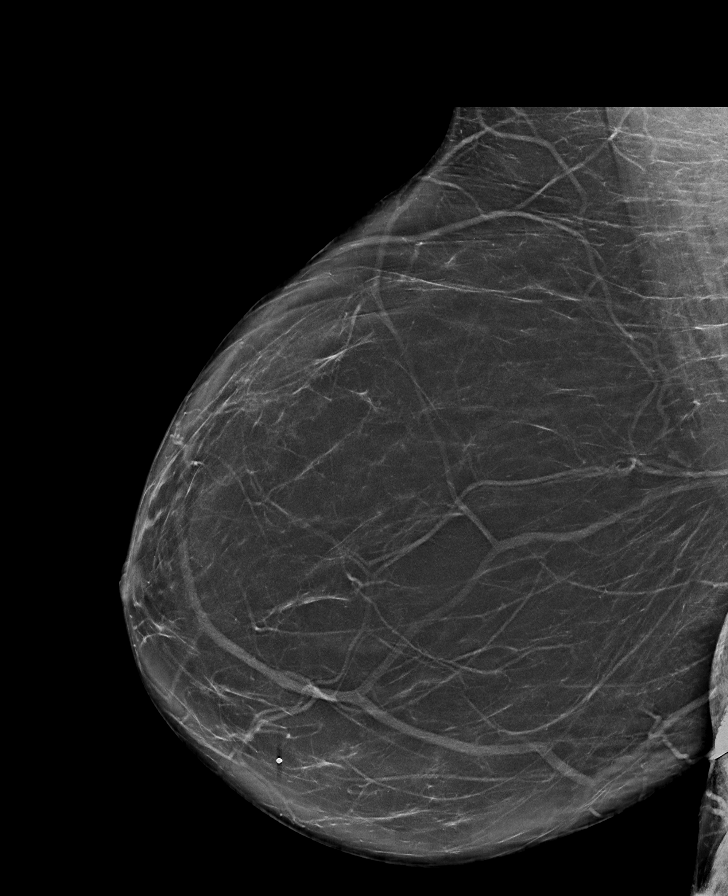

[R CC synth-2D]
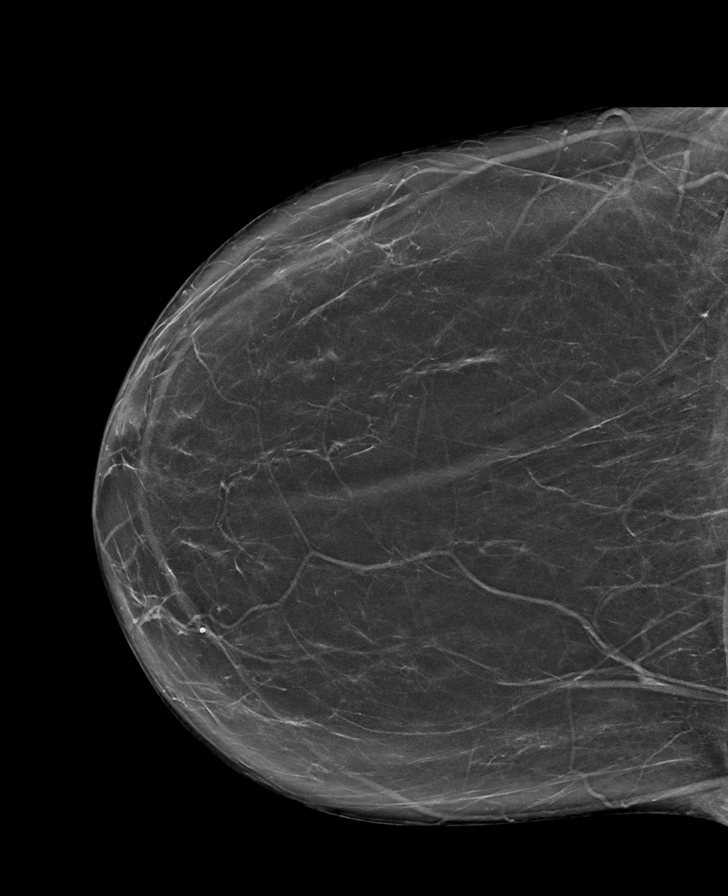

[L CC synth-2D]
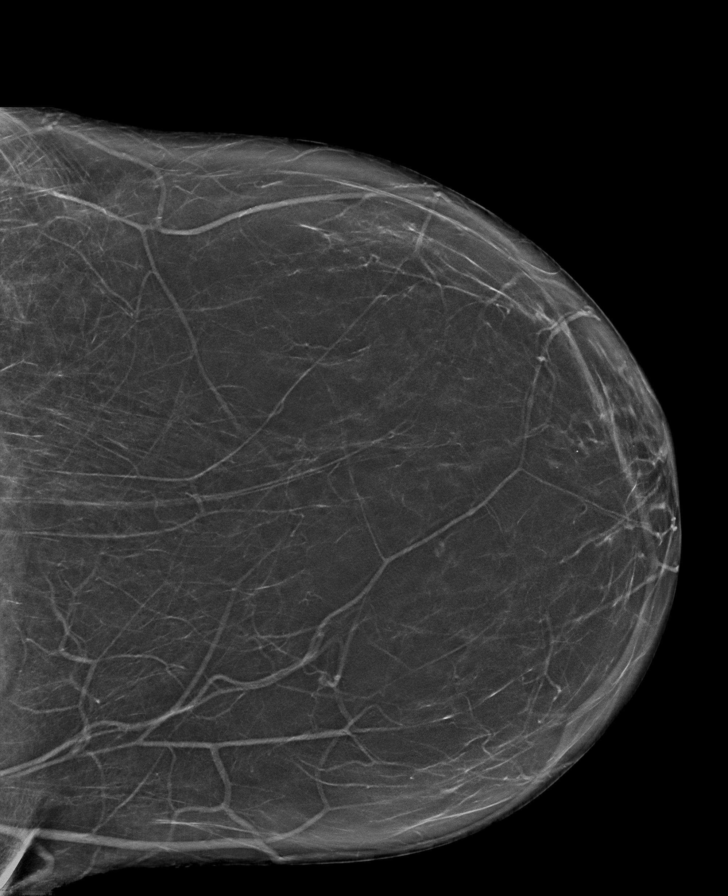

[R MLO tomo · tomo slice 45/88.0]
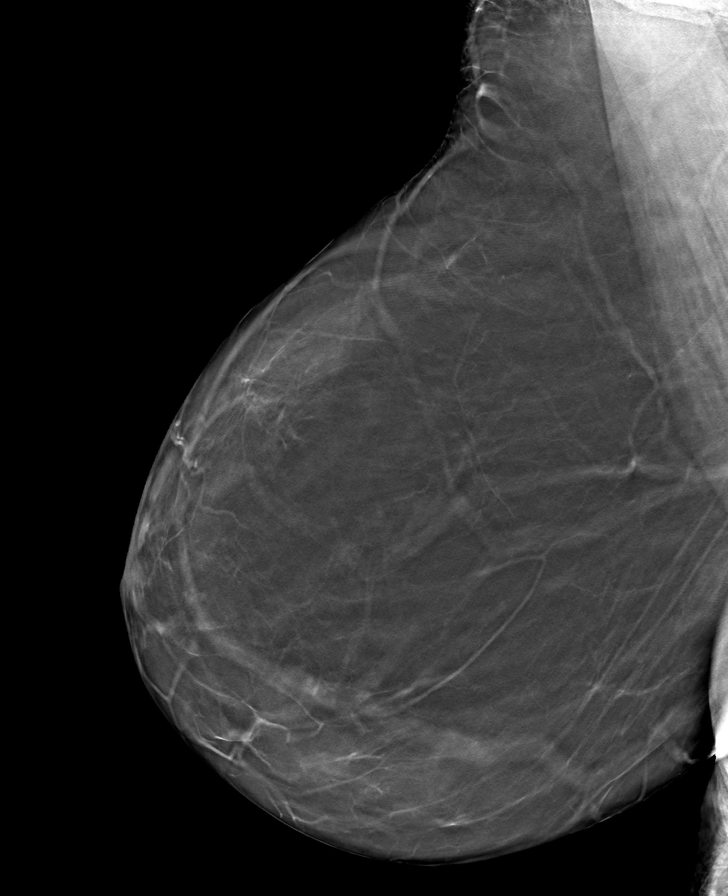

[R CC tomo · tomo slice 41/81.0]
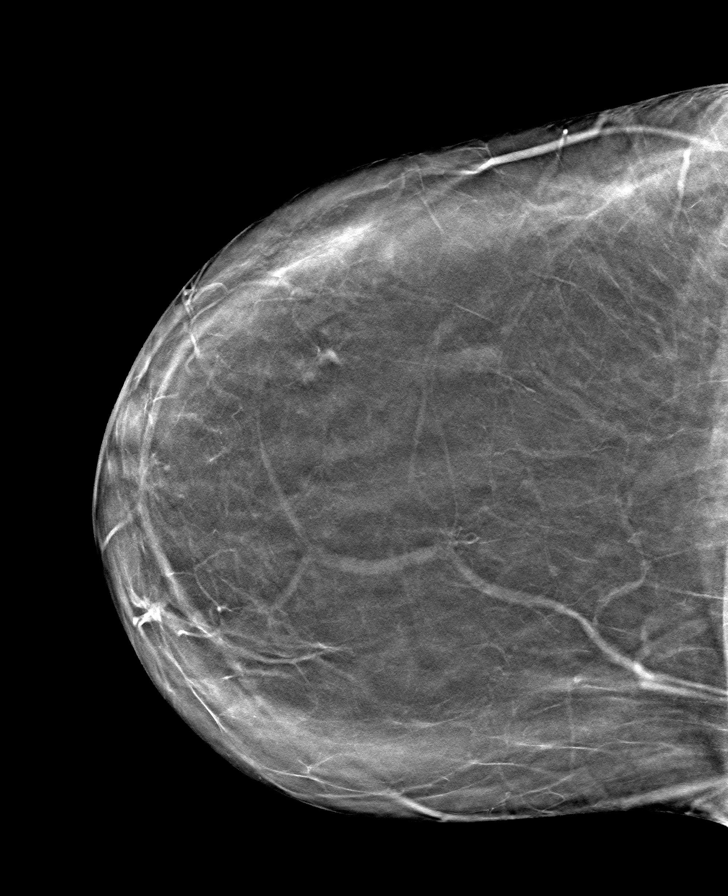

[L CC tomo · tomo slice 37/74.0]
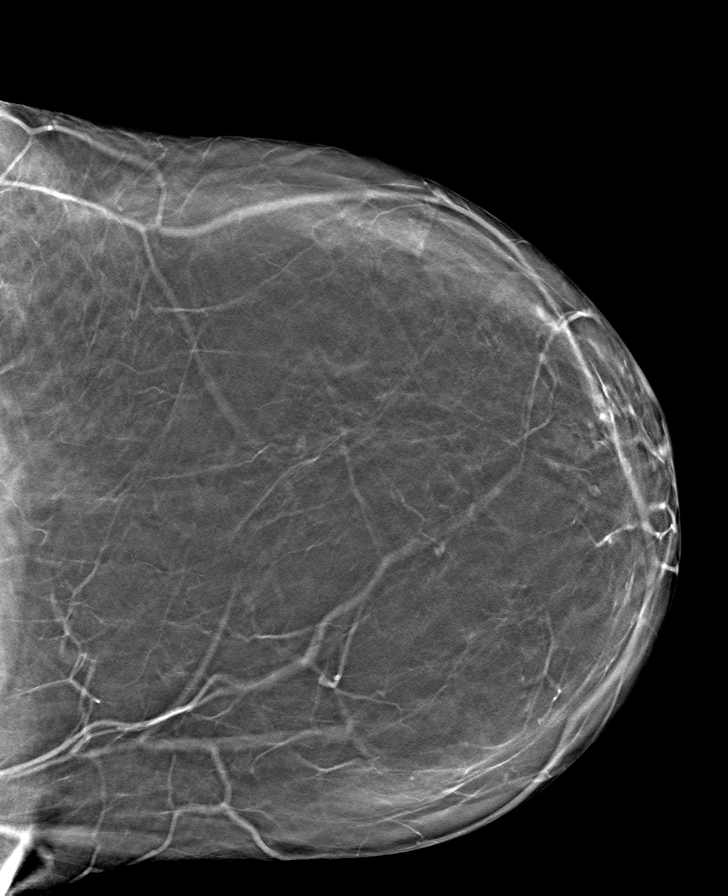

[L MLO tomo · tomo slice 43/85.0]
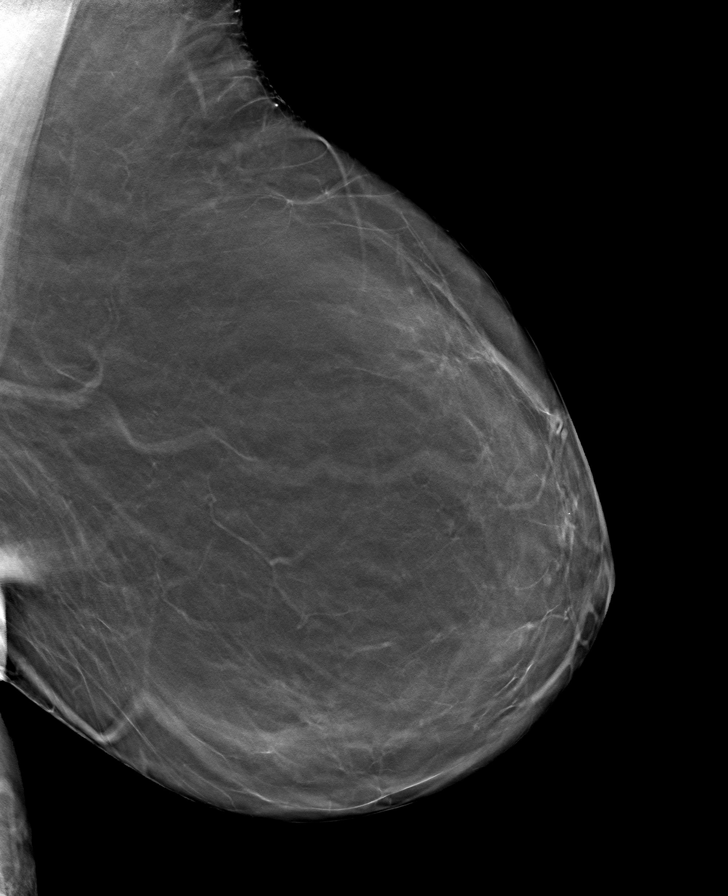

[8 of 24 positions shown; findings below may reference images not displayed]

FINDINGS: There are no findings suspicious for malignancy.
IMPRESSION: No mammographic evidence of malignancy. A result letter of this
screening mammogram will be mailed directly to the patient.

RECOMMENDATION:
Screening mammogram in one year. (Code:0E-3-N98)

BI-RADS CATEGORY  1: Negative.

## 2022-04-24 DIAGNOSIS — Z1211 Encounter for screening for malignant neoplasm of colon: Secondary | ICD-10-CM | POA: Diagnosis not present

## 2022-04-30 DIAGNOSIS — J4520 Mild intermittent asthma, uncomplicated: Secondary | ICD-10-CM | POA: Diagnosis not present

## 2022-04-30 DIAGNOSIS — R030 Elevated blood-pressure reading, without diagnosis of hypertension: Secondary | ICD-10-CM | POA: Diagnosis not present

## 2022-04-30 DIAGNOSIS — N3000 Acute cystitis without hematuria: Secondary | ICD-10-CM | POA: Diagnosis not present

## 2022-04-30 DIAGNOSIS — Z0000 Encounter for general adult medical examination without abnormal findings: Secondary | ICD-10-CM | POA: Diagnosis not present

## 2022-04-30 DIAGNOSIS — M810 Age-related osteoporosis without current pathological fracture: Secondary | ICD-10-CM | POA: Diagnosis not present

## 2022-04-30 DIAGNOSIS — E7800 Pure hypercholesterolemia, unspecified: Secondary | ICD-10-CM | POA: Diagnosis not present

## 2022-04-30 DIAGNOSIS — E039 Hypothyroidism, unspecified: Secondary | ICD-10-CM | POA: Diagnosis not present

## 2022-05-07 DIAGNOSIS — G4733 Obstructive sleep apnea (adult) (pediatric): Secondary | ICD-10-CM | POA: Diagnosis not present

## 2022-05-16 DIAGNOSIS — M26609 Unspecified temporomandibular joint disorder, unspecified side: Secondary | ICD-10-CM | POA: Diagnosis not present

## 2022-05-16 DIAGNOSIS — J0100 Acute maxillary sinusitis, unspecified: Secondary | ICD-10-CM | POA: Diagnosis not present

## 2022-05-27 ENCOUNTER — Encounter: Payer: Self-pay | Admitting: Neurology

## 2022-05-27 ENCOUNTER — Ambulatory Visit: Payer: Medicare Other | Admitting: Neurology

## 2022-05-27 VITALS — BP 155/87 | HR 70 | Ht 65.0 in | Wt 196.5 lb

## 2022-05-27 DIAGNOSIS — G43709 Chronic migraine without aura, not intractable, without status migrainosus: Secondary | ICD-10-CM

## 2022-05-27 MED ORDER — UBRELVY 100 MG PO TABS: 100.0000 mg | ORAL_TABLET | ORAL | 11 refills | Status: DC | PRN

## 2022-05-27 MED ORDER — TOPIRAMATE 25 MG PO TABS: ORAL_TABLET | ORAL | 6 refills | Status: DC

## 2022-05-27 NOTE — Progress Notes (Signed)
PATIENT: Stephanie Finley DOB: 27-Dec-1950  REASON FOR VISIT: Follow up for migraine HISTORY FROM: Patient PRIMARY NEUROLOGIST: Dr. [name]  HISTORY  Stephanie Finley is a 71 year old female, seen in request by her primary care physician Dr. [name] for evaluation of chronic migraine, initial evaluation was on July 26, 2019.   I have reviewed and summarized the referring note from the referring physician.  She had past medical history of chronic migraine since childhood, her typical migraine lateralized retro-orbital area severe pounding headache with associated light, noise sensitivity, nauseous, lasting for few hours to 1 day, she used to have migraines about couple times each month, but since May 2020, she began to have increased frequency of migraine, 2-3 times each week, sometimes her migraine preceded by loss of vision in the right visual field,   Previously she took Topamax as migraine prevention, which does help her migraine, she has a questionable history of kidney stone, she also complained side effect of Topamax weight gain, feeling fatigued,   For abortive treatment, previously she has tried different triptans, Imitrex, Maxalt, does not help her headache, she is not taking Fioricet as needed, which is helpful.   Update January 18, 2020 SS: When last seen, was started on Lamictal working up to 100 mg twice a day, tolerating well.  Her headaches are doing really well.  On average having 1 migraine a week, this is the " gold standard" for her.  She will take Fioricet with excellent benefit.  She is pleased how well her headaches are doing.  Has history of anxiety, fibromyalgia, IBS, scoliosis.  She presents today unaccompanied.   Update November 11, 2020 SS: Here today via VV. No longer taking Lamictal, she thinks she doesn't need a daily preventative med. Had bad end of Oct/1st of Nov, horrible with allergies, relative died of COVID. Since middle of November-relatively migraine  free, except for occasional migraine 1 every 2 weeks, does have 1 today. Will take 1/2 tablet Fioricet and Tizanidine, takes other half of Fioricet later if needed. This is best combo she has ever had. Is working out, has lost 10 lbs over last 3 months. Sleep study has shown OSA, will be started CPAP.   Update November 11, 2021 SS: Here today alone, had bad month in December/January, that was a cluster, related to holidays/weather/UTI/bronchitis. Getting used to CPAP. Takes Fioricet (1/2 tablet), tizanidine as combo for headaches. Of recent having to take 2-3 tablets a week. Still doesn't want a preventative, in past Topamax worked best for her.   Update May 27, 2022 SS: Since last seen more migraines, cluster lasted for 15 days in July and August. Had ear infection afterwards. Thinking about preventative. Describes typical migraine right sided, tingling, stiffness to right arm. Tension to right shoulder. Extends to right leg with symptoms. Took Fioricet, but stopped it, worried about rebound headache. Had extremely stressful summer. Here to discuss preventative. Tried and failed: Topamax, propranolol, Imitrex, Maxalt. Migraines are typical pattern for her.   REVIEW OF SYSTEMS: Out of a complete 14 system review of symptoms, the patient complains only of the following symptoms, and all other reviewed systems are negative.  See HPI  ALLERGIES: Allergies  Allergen Reactions   Doxycycline Diarrhea   Penicillins Hives    HOME MEDICATIONS: Outpatient Medications Prior to Visit  Medication Sig Dispense Refill   albuterol (VENTOLIN HFA) 108 (90 Base) MCG/ACT inhaler Inhale 2 puffs into the lungs every 4 (four) hours as needed.  butalbital-acetaminophen-caffeine (FIORICET) 50-325-40 MG tablet TAKE 1 TABLET BY MOUTH EVERY 6 HOURS AS NEEDED FOR HEADACHE 12 tablet 3   calcium carbonate (TUMS - DOSED IN MG ELEMENTAL CALCIUM) 500 MG chewable tablet Chew 1 tablet by mouth as needed for indigestion or  heartburn.     FLUoxetine (PROZAC) 20 MG capsule Take 20 mg by mouth daily.     fluticasone (FLONASE) 50 MCG/ACT nasal spray Place 2 sprays into both nostrils as needed.      levothyroxine (SYNTHROID) 50 MCG tablet Take 50 mcg by mouth every morning.     omeprazole (PRILOSEC) 10 MG capsule Take 10 mg by mouth daily.     SYMBICORT 160-4.5 MCG/ACT inhaler Inhale 2 puffs into the lungs daily.      tiZANidine (ZANAFLEX) 4 MG tablet Take 0.5 tablets (2 mg total) by mouth every 6 (six) hours as needed for muscle spasms. 30 tablet 0   No facility-administered medications prior to visit.    PAST MEDICAL HISTORY: Past Medical History:  Diagnosis Date   Fibromyalgia    Headache    Migraines    Osteopenia    Scoliosis     PAST SURGICAL HISTORY: Past Surgical History:  Procedure Laterality Date   CYST EXCISION     1986   LAPAROTOMY     x 3    FAMILY HISTORY: Family History  Problem Relation Age of Onset   Ovarian cancer Mother    Brain cancer Father    Rheum arthritis Sister    Heart attack Paternal Uncle     SOCIAL HISTORY: Social History   Socioeconomic History   Marital status: Divorced    Spouse name: Not on file   Number of children: 0   Years of education: college   Highest education level: Bachelor's degree (e.g., BA, AB, BS)  Occupational History   Occupation: Retired  Tobacco Use   Smoking status: Former    Packs/day: 1.00    Years: 30.00    Total pack years: 30.00    Types: Cigarettes    Quit date: 09/24/2015    Years since quitting: 6.6   Smokeless tobacco: Never  Substance and Sexual Activity   Alcohol use: No    Alcohol/week: 0.0 standard drinks of alcohol   Drug use: No   Sexual activity: Not on file  Other Topics Concern   Not on file  Social History Narrative   Patient lives at home alone w/cat.   Patient is retired and has her B.S.    Caffeine - two cups daily.   Patient is divorced.    Patient has a college education.    Patient has no  children.       Social Determinants of Health   Financial Resource Strain: Not on file  Food Insecurity: Not on file  Transportation Needs: Not on file  Physical Activity: Not on file  Stress: Not on file  Social Connections: Not on file  Intimate Partner Violence: Not on file   PHYSICAL EXAM  Vitals:   05/27/22 1447  BP: (!) 155/87  Pulse: 70  Weight: 196 lb 8 oz (89.1 kg)  Height: 5\' 5"  (1.651 m)    Body mass index is 32.7 kg/m.  Generalized: Well developed, in no acute distress   Neurological examination  Mentation: Alert oriented to time, place, history taking. Follows all commands speech and language fluent Cranial nerve II-XII: Pupils were equal round reactive to light. Extraocular movements were full, visual field were full on confrontational  test. Facial sensation and strength were normal.  Head turning and shoulder shrug  were normal and symmetric. Motor: The motor testing reveals 5 over 5 strength of all 4 extremities. Good symmetric motor tone is noted throughout.  Sensory: Sensory testing is intact to soft touch on all 4 extremities. No evidence of extinction is noted.  Coordination: Cerebellar testing reveals good finger-nose-finger and heel-to-shin bilaterally.  Gait and station: Gait is normal.  Reflexes: Deep tendon reflexes are symmetric and normal bilaterally.   DIAGNOSTIC DATA (LABS, IMAGING, TESTING) - I reviewed patient records, labs, notes, testing and imaging myself where available.  Lab Results  Component Value Date   WBC 4.0 07/04/2010   HGB 13.9 07/04/2010   HCT 41.0 07/04/2010   MCV 95.0 07/04/2010   PLT 144 (L) 07/04/2010      Component Value Date/Time   NA 145 07/04/2010 1722   K 3.8 07/04/2010 1722   CL 119 (H) 07/04/2010 1722   GLUCOSE 95 07/04/2010 1722   BUN 19 07/04/2010 1722   CREATININE 1.1 07/04/2010 1722   No results found for: "CHOL", "HDL", "LDLCALC", "LDLDIRECT", "TRIG", "CHOLHDL" No results found for: "HGBA1C" No  results found for: "VITAMINB12" No results found for: "TSH"  ASSESSMENT AND PLAN 71 y.o. year old female   1.  Chronic migraine headache  -Start Topamax working up to 50 mg at bedtime for migraine preventative -Try Ubrelvy 100 mg as needed for acute headache -Would not recommend Fioricet right now, having too many headaches at risk for rebound headache, would not recommend triptan due to age, neurological symptoms with headaches -If she has side effects with Topamax, consider CGRP, but she is worried about the cost, Zonegran may be another good option -Encouraged to reach out for medication adjustment, otherwise I will see her back in 6 months -Tried and failed: Topamax, propranolol, Imitrex, Maxalt.  Margie Ege, AGNP-C, DNP 05/27/2022, 3:13 PM [facility] [address] [place], [place] [address] [phone]

## 2022-05-27 NOTE — Patient Instructions (Signed)
Start Topamax 25 mg at bedtime x 3 nights, then increase to 50 mg at bedtime Try ubrevly at onset of headache, I would not use the Fioricet right now, worry about rebound headache  See you back in 6 months   Meds ordered this encounter  Medications   topiramate (TOPAMAX) 25 MG tablet    Sig: Take 1 tablet at bedtime x 3 nights, then take 2 at night    Dispense:  60 tablet    Refill:  6   Ubrogepant (UBRELVY) 100 MG TABS    Sig: Take 100 mg by mouth as needed (take 1 tablet at onset of headache, may repeat in 2 hours if needed, max is 200 mg in 24 hours).    Dispense:  10 tablet    Refill:  11

## 2022-08-14 ENCOUNTER — Other Ambulatory Visit: Payer: Self-pay | Admitting: Family Medicine

## 2022-08-14 DIAGNOSIS — Z1231 Encounter for screening mammogram for malignant neoplasm of breast: Secondary | ICD-10-CM

## 2022-08-21 ENCOUNTER — Other Ambulatory Visit: Payer: Self-pay | Admitting: Neurology

## 2022-08-21 DIAGNOSIS — G43709 Chronic migraine without aura, not intractable, without status migrainosus: Secondary | ICD-10-CM

## 2022-09-11 ENCOUNTER — Telehealth: Payer: Self-pay | Admitting: Cardiovascular Disease

## 2022-09-11 NOTE — Telephone Encounter (Signed)
Patient is requesting to speak with sleep coordinator regarding getting new equipment.

## 2022-09-14 ENCOUNTER — Encounter: Payer: Self-pay | Admitting: Cardiovascular Disease

## 2022-09-14 NOTE — Telephone Encounter (Signed)
Message left per the letter received from [facility] she is to call Jasmine December with [facility] to let her know where she would like for her care to be transferred. [facility] does not take her [facility].

## 2022-09-15 ENCOUNTER — Telehealth: Payer: Self-pay | Admitting: *Deleted

## 2022-09-15 NOTE — Telephone Encounter (Signed)
Records and order to manage CPAP and supplies faxed to [facility] per patient request. She is a transfer from [facility].

## 2022-09-17 DIAGNOSIS — G4733 Obstructive sleep apnea (adult) (pediatric): Secondary | ICD-10-CM | POA: Diagnosis not present

## 2022-10-14 ENCOUNTER — Ambulatory Visit
Admission: RE | Admit: 2022-10-14 | Discharge: 2022-10-14 | Disposition: A | Discharge status: Home / Self Care | Payer: Medicare Other | Source: Ambulatory Visit | Attending: Family Medicine | Admitting: Family Medicine

## 2022-10-14 DIAGNOSIS — Z1231 Encounter for screening mammogram for malignant neoplasm of breast: Secondary | ICD-10-CM | POA: Diagnosis not present

## 2022-10-20 DIAGNOSIS — G4733 Obstructive sleep apnea (adult) (pediatric): Secondary | ICD-10-CM | POA: Diagnosis not present

## 2022-10-28 DIAGNOSIS — G4733 Obstructive sleep apnea (adult) (pediatric): Secondary | ICD-10-CM | POA: Diagnosis not present

## 2022-11-02 DIAGNOSIS — G43709 Chronic migraine without aura, not intractable, without status migrainosus: Secondary | ICD-10-CM | POA: Diagnosis not present

## 2022-11-02 DIAGNOSIS — E039 Hypothyroidism, unspecified: Secondary | ICD-10-CM | POA: Diagnosis not present

## 2022-11-03 DIAGNOSIS — H43813 Vitreous degeneration, bilateral: Secondary | ICD-10-CM | POA: Diagnosis not present

## 2022-11-03 DIAGNOSIS — H33301 Unspecified retinal break, right eye: Secondary | ICD-10-CM | POA: Diagnosis not present

## 2022-11-05 DIAGNOSIS — H43813 Vitreous degeneration, bilateral: Secondary | ICD-10-CM | POA: Diagnosis not present

## 2022-11-05 DIAGNOSIS — G43109 Migraine with aura, not intractable, without status migrainosus: Secondary | ICD-10-CM | POA: Diagnosis not present

## 2022-11-05 DIAGNOSIS — H353124 Nonexudative age-related macular degeneration, left eye, advanced atrophic with subfoveal involvement: Secondary | ICD-10-CM | POA: Diagnosis not present

## 2022-11-05 DIAGNOSIS — H33321 Round hole, right eye: Secondary | ICD-10-CM | POA: Diagnosis not present

## 2022-11-11 ENCOUNTER — Ambulatory Visit: Payer: Medicare Other | Admitting: Neurology

## 2022-12-01 ENCOUNTER — Ambulatory Visit: Payer: Medicare Other | Admitting: Neurology

## 2022-12-01 ENCOUNTER — Encounter: Payer: Self-pay | Admitting: Neurology

## 2022-12-01 DIAGNOSIS — G43709 Chronic migraine without aura, not intractable, without status migrainosus: Secondary | ICD-10-CM

## 2022-12-01 MED ORDER — BUTALBITAL-APAP-CAFFEINE 50-325-40 MG PO TABS: ORAL_TABLET | ORAL | 3 refills | Status: DC

## 2022-12-01 MED ORDER — TOPIRAMATE 100 MG PO TABS: 100.0000 mg | ORAL_TABLET | Freq: Every day | ORAL | 3 refills | Status: DC

## 2022-12-01 MED ORDER — TIZANIDINE HCL 4 MG PO TABS: ORAL_TABLET | ORAL | 3 refills | Status: DC

## 2022-12-01 NOTE — Progress Notes (Signed)
PATIENT: Stephanie Finley DOB: [birth year]  REASON FOR VISIT: Follow up for migraine HISTORY FROM: Patient PRIMARY NEUROLOGIST: Dr. Krista Blue  HISTORY  Stephanie Finley is a 72 year old female, seen in request by her primary care physician Dr. Sela Hilding for evaluation of chronic migraine, initial evaluation was on July 26, 2019.   I have reviewed and summarized the referring note from the referring physician.  She had past medical history of chronic migraine since childhood, her typical migraine lateralized retro-orbital area severe pounding headache with associated light, noise sensitivity, nauseous, lasting for few hours to 1 day, she used to have migraines about couple times each month, but since May 2020, she began to have increased frequency of migraine, 2-3 times each week, sometimes her migraine preceded by loss of vision in the right visual field,   Previously she took Topamax as migraine prevention, which does help her migraine, she has a questionable history of kidney stone, she also complained side effect of Topamax weight gain, feeling fatigued,   For abortive treatment, previously she has tried different triptans, Imitrex, Maxalt, does not help her headache, she is not taking Fioricet as needed, which is helpful.   Update January 18, 2020 SS: When last seen, was started on Lamictal working up to 100 mg twice a day, tolerating well.  Her headaches are doing really well.  On average having 1 migraine a week, this is the " gold standard" for her.  She will take Fioricet with excellent benefit.  She is pleased how well her headaches are doing.  Has history of anxiety, fibromyalgia, IBS, scoliosis.  She presents today unaccompanied.   Update November 11, 2020 SS: Here today via VV. No longer taking Lamictal, she thinks she doesn't need a daily preventative med. Had bad end of Oct/1st of Nov, horrible with allergies, relative died of COVID. Since middle of November-relatively migraine free,  except for occasional migraine 1 every 2 weeks, does have 1 today. Will take 1/2 tablet Fioricet and Tizanidine, takes other half of Fioricet later if needed. This is best combo she has ever had. Is working out, has lost 10 lbs over last 3 months. Sleep study has shown OSA, will be started CPAP.   Update November 11, 2021 SS: Here today alone, had bad month in December/January, that was a cluster, related to holidays/weather/UTI/bronchitis. Getting used to CPAP. Takes Fioricet (1/2 tablet), tizanidine as combo for headaches. Of recent having to take 2-3 tablets a week. Still doesn't want a preventative, in past Topamax worked best for her.   Update May 27, 2022 SS: Since last seen more migraines, cluster lasted for 15 days in July and August. Had ear infection afterwards. Thinking about preventative. Describes typical migraine right sided, tingling, stiffness to right arm. Tension to right shoulder. Extends to right leg with symptoms. Took Fioricet, but stopped it, worried about rebound headache. Had extremely stressful summer. Here to discuss preventative. Tried and failed: Topamax, propranolol, Imitrex, Maxalt. Migraines are typical pattern for her.   Update December 01, 2022 SS: We started Topamax at last visit, 50 mg at bedtime. No side effects. Lately 1 migraine a week, this is a good # for her. Taking Fioricet, it works great only taking 1/2 Fioricet and 1/2 tizanidine, lay down. Roselyn Meier was too expensive. Switched from Prozac to Paxil. She is pleased with her migraine management. Still has right sided tingling with migraines.   REVIEW OF SYSTEMS: Out of a complete 14 system review of symptoms, the patient complains only of  the following symptoms, and all other reviewed systems are negative.  See HPI  ALLERGIES: Allergies  Allergen Reactions   Doxycycline Diarrhea   Penicillins Hives    HOME MEDICATIONS: Outpatient Medications Prior to Visit  Medication Sig Dispense Refill   albuterol  (VENTOLIN HFA) 108 (90 Base) MCG/ACT inhaler Inhale 2 puffs into the lungs every 4 (four) hours as needed.      calcium carbonate (TUMS - DOSED IN MG ELEMENTAL CALCIUM) 500 MG chewable tablet Chew 1 tablet by mouth as needed for indigestion or heartburn.     fluticasone (FLONASE) 50 MCG/ACT nasal spray Place 2 sprays into both nostrils as needed.      levothyroxine (SYNTHROID) 50 MCG tablet Take 50 mcg by mouth every morning.     omeprazole (PRILOSEC) 10 MG capsule Take 10 mg by mouth daily.     paroxetine (PAXIL) 10 MG/5ML suspension Take 10 mg by mouth every morning.     SYMBICORT 160-4.5 MCG/ACT inhaler Inhale 2 puffs into the lungs daily.      butalbital-acetaminophen-caffeine (FIORICET) 50-325-40 MG tablet TAKE 1 TABLET BY MOUTH EVERY 6 HOURS AS NEEDED FOR HEADACHE 12 tablet 3   tiZANidine (ZANAFLEX) 4 MG tablet TAKE 1/2 TABLET(2 MG) BY MOUTH EVERY 6 HOURS AS NEEDED FOR MUSCLE SPASMS 30 tablet 0   topiramate (TOPAMAX) 25 MG tablet Take 1 tablet at bedtime x 3 nights, then take 2 at night 60 tablet 6   FLUoxetine (PROZAC) 20 MG capsule Take 20 mg by mouth daily.     Ubrogepant (UBRELVY) 100 MG TABS Take 100 mg by mouth as needed (take 1 tablet at onset of headache, may repeat in 2 hours if needed, max is 200 mg in 24 hours). 10 tablet 11   No facility-administered medications prior to visit.    PAST MEDICAL HISTORY: Past Medical History:  Diagnosis Date   Fibromyalgia    Headache    Migraines    Osteopenia    Scoliosis     PAST SURGICAL HISTORY: Past Surgical History:  Procedure Laterality Date   CYST EXCISION     1986   LAPAROTOMY     x 3    FAMILY HISTORY: Family History  Problem Relation Age of Onset   Ovarian cancer Mother    Brain cancer Father    Rheum arthritis Sister    Heart attack Paternal Uncle     SOCIAL HISTORY: Social History   Socioeconomic History   Marital status: Divorced    Spouse name: Not on file   Number of children: 0   Years of education:  college   Highest education level: Bachelor's degree (e.g., BA, AB, BS)  Occupational History   Occupation: Retired  Tobacco Use   Smoking status: Former    Packs/day: 1.00    Years: 30.00    Total pack years: 30.00    Types: Cigarettes    Quit date: 09/24/2015    Years since quitting: 7.1   Smokeless tobacco: Never  Substance and Sexual Activity   Alcohol use: No    Alcohol/week: 0.0 standard drinks of alcohol   Drug use: No   Sexual activity: Not on file  Other Topics Concern   Not on file  Social History Narrative   Patient lives at home alone w/cat.   Patient is retired and has her B.S.    Caffeine - two cups daily.   Patient is divorced.    Patient has a college education.    Patient has no  children.       Social Determinants of Health   Financial Resource Strain: Not on file  Food Insecurity: Not on file  Transportation Needs: Not on file  Physical Activity: Not on file  Stress: Not on file  Social Connections: Not on file  Intimate Partner Violence: Not on file   PHYSICAL EXAM  Vitals:   12/01/22 1305  BP: (!) 167/92  Pulse: 80  Weight: 190 lb (86.2 kg)  Height: '5\' 4"'$  (1.626 m)   Body mass index is 32.61 kg/m.  Generalized: Well developed, in no acute distress   Neurological examination  Mentation: Alert oriented to time, place, history taking. Follows all commands speech and language fluent Cranial nerve II-XII: Pupils were equal round reactive to light. Extraocular movements were full, visual field were full on confrontational test. Facial sensation and strength were normal.  Head turning and shoulder shrug  were normal and symmetric. Motor: The motor testing reveals 5 over 5 strength of all 4 extremities. Good symmetric motor tone is noted throughout.  Sensory: Sensory testing is intact to soft touch on all 4 extremities. No evidence of extinction is noted.  Coordination: Cerebellar testing reveals good finger-nose-finger and heel-to-shin  bilaterally.  Gait and station: Gait is normal.  Reflexes: Deep tendon reflexes are symmetric and normal bilaterally.   DIAGNOSTIC DATA (LABS, IMAGING, TESTING) - I reviewed patient records, labs, notes, testing and imaging myself where available.  Lab Results  Component Value Date   WBC 4.0 07/04/2010   HGB 13.9 07/04/2010   HCT 41.0 07/04/2010   MCV 95.0 07/04/2010   PLT 144 (L) 07/04/2010      Component Value Date/Time   NA 145 07/04/2010 1722   K 3.8 07/04/2010 1722   CL 119 (H) 07/04/2010 1722   GLUCOSE 95 07/04/2010 1722   BUN 19 07/04/2010 1722   CREATININE 1.1 07/04/2010 1722   No results found for: "CHOL", "HDL", "LDLCALC", "LDLDIRECT", "TRIG", "CHOLHDL" No results found for: "HGBA1C" No results found for: "VITAMINB12" No results found for: "TSH"  ASSESSMENT AND PLAN 72 y.o. year old female   1.  Chronic migraine headache  -Increase Topamax to 100 mg at bedtime for migraine preventative -Continue Fioricet as needed for acute headache, may combine with tizanidine -Tried and failed: Topamax, propranolol, Imitrex, Maxalt, Ubrelvy used too expensive, prefer to not use triptans due to age and neurological symptoms with migraines  -Follow up in 6 months or sooner if needed   Evangeline Dakin, DNP 12/01/2022, 1:39 PM [facility] [address], [place],  [address] [phone]

## 2022-12-01 NOTE — Patient Instructions (Addendum)
Try to increase the Topamax to 100 mg at bedtime, watch for side effect  Keep the Fioricet as needed, combine with tizanidine   See you back in 6 months

## 2022-12-18 DIAGNOSIS — H33321 Round hole, right eye: Secondary | ICD-10-CM | POA: Diagnosis not present

## 2022-12-18 DIAGNOSIS — H353124 Nonexudative age-related macular degeneration, left eye, advanced atrophic with subfoveal involvement: Secondary | ICD-10-CM | POA: Diagnosis not present

## 2022-12-18 DIAGNOSIS — H43813 Vitreous degeneration, bilateral: Secondary | ICD-10-CM | POA: Diagnosis not present

## 2022-12-18 DIAGNOSIS — H353111 Nonexudative age-related macular degeneration, right eye, early dry stage: Secondary | ICD-10-CM | POA: Diagnosis not present

## 2022-12-24 ENCOUNTER — Ambulatory Visit: Payer: Medicare Other | Admitting: Cardiovascular Disease

## 2023-01-15 DIAGNOSIS — H353124 Nonexudative age-related macular degeneration, left eye, advanced atrophic with subfoveal involvement: Secondary | ICD-10-CM | POA: Diagnosis not present

## 2023-01-25 DIAGNOSIS — G4733 Obstructive sleep apnea (adult) (pediatric): Secondary | ICD-10-CM | POA: Diagnosis not present

## 2023-02-01 ENCOUNTER — Telehealth: Payer: Self-pay | Admitting: Neurology

## 2023-02-01 NOTE — Telephone Encounter (Signed)
Pt stated she is taking topiramate (TOPAMAX) 100 MG tablet. Stated it's giving her diarrhea and she wants to talk to nurse about medication.

## 2023-03-26 DIAGNOSIS — H353111 Nonexudative age-related macular degeneration, right eye, early dry stage: Secondary | ICD-10-CM | POA: Diagnosis not present

## 2023-03-26 DIAGNOSIS — H43813 Vitreous degeneration, bilateral: Secondary | ICD-10-CM | POA: Diagnosis not present

## 2023-03-26 DIAGNOSIS — H33321 Round hole, right eye: Secondary | ICD-10-CM | POA: Diagnosis not present

## 2023-03-26 DIAGNOSIS — H353124 Nonexudative age-related macular degeneration, left eye, advanced atrophic with subfoveal involvement: Secondary | ICD-10-CM | POA: Diagnosis not present

## 2023-04-21 ENCOUNTER — Encounter: Payer: Self-pay | Admitting: Cardiovascular Disease

## 2023-04-21 ENCOUNTER — Ambulatory Visit: Payer: Medicare Other | Attending: Cardiovascular Disease | Admitting: Cardiovascular Disease

## 2023-04-21 DIAGNOSIS — R002 Palpitations: Secondary | ICD-10-CM | POA: Diagnosis not present

## 2023-04-21 DIAGNOSIS — E039 Hypothyroidism, unspecified: Secondary | ICD-10-CM

## 2023-04-21 DIAGNOSIS — K219 Gastro-esophageal reflux disease without esophagitis: Secondary | ICD-10-CM

## 2023-04-21 DIAGNOSIS — G43709 Chronic migraine without aura, not intractable, without status migrainosus: Secondary | ICD-10-CM

## 2023-04-21 DIAGNOSIS — G4733 Obstructive sleep apnea (adult) (pediatric): Secondary | ICD-10-CM

## 2023-04-21 NOTE — Patient Instructions (Signed)
Medication Instructions:  NO CHANGES *If you need a refill on your cardiac medications before your next appointment, please call your pharmacy*   Lab Work: NO CHANGES If you have labs (blood work) drawn today and your tests are completely normal, you will receive your results only by: MyChart Message (if you have MyChart) OR A paper copy in the mail If you have any lab test that is abnormal or we need to change your treatment, we will call you to review the results.   Testing/Procedures: NONE   Follow-Up: At [facility], you and your health needs are our priority.  As part of our continuing mission to provide you with exceptional heart care, we have created designated Provider Care Teams.  These Care Teams include your primary Cardiologist (physician) and Advanced Practice Providers (APPs -  Physician Assistants and Nurse Practitioners) who all work together to provide you with the care you need, when you need it.  We recommend signing up for the patient portal called "MyChart".  Sign up information is provided on this After Visit Summary.  MyChart is used to connect with patients for Virtual Visits (Telemedicine).  Patients are able to view lab/test results, encounter notes, upcoming appointments, etc.  Non-urgent messages can be sent to your provider as well.   To learn more about what you can do with MyChart, go to [email or web address].    Your next appointment:   10 month(s)  Provider:   DR. Tresa Endo

## 2023-04-21 NOTE — Progress Notes (Signed)
Cardiology Office Note    Date:  04/25/2023   ID:  Stephanie Finley, DOB Jul 03, 1951, MRN [id number]  PCP:  Shon Hale, MD  Cardiologist:  Nicki Guadalajara, MD (sleep); Dr. Lethea Killings  21 month F/U sleep evaluation   History of Present Illness:  Stephanie Finley is a 72 y.o. female who is followed by Dr. Lethea Killings from a cardiology perspective.  She has a history of fibromyalgia follows palpitations.  Often, her palpitations occur while sleeping.  She has a history of snoring and has had frequent daily fatigue which had coincided with a diagnosis of hypothyroidism.  Her primary care physician is Dr. [name].  She has a remote tobacco history of smoking for 30 years but fortunately quit approximately 5 years ago.   I saw her for my initial sleep consultation on July 02, 2021.  Due to concerns for obstructive sleep apnea, she was referred for an initial home sleep test which was done on July 20, 2020.  On this study, she was felt to have mild overall sleep apnea with an AHI of 6.3.  However she had severe oxygen desaturation to a nadir of 79% raising concern for more significant sleep apnea particularly during REM sleep which could not be assessed on this home study.  He subsequently was referred for an in lab CPAP titration which was done on September 10, 2020.  CPAP was initiated at 5 cm and was titrated to 14 cm water pressure.  Her AHI at 12 cm was 0.6 with only 30 seconds of REM sleep and at 14 cm was 0 with O2 nadir 90% and 11 minutes and 48 seconds of REM sleep.  Unfortunately, due to supply chain issues, there was a significant weight prior to obtaining a new machine.  Finally, on April 07, 2021 she received a new ResMed air sense 11 AutoSet unit with initial settings at a minimum pressure of 12 up to 18 cm of water.  Her initial ramp time was 45 minutes with a ramp start at 4.  Typically she has been going to bed between 11 and 1130 and wakes up between 9 and 9:30 AM.  A  download was obtained from June 02, 2021 through July 01, 2021.  She is meeting compliance standards although did miss several days with usage at 80%.  Average use on days used was 6 hours and 6 minutes.  Her 95th percentile pressure was 14.8 with a maximum average pressure of 15.6.  AHI was 2.9/h.  Since initiating CPAP, she has felt improved.  Previously she had nocturia 4 times per night, she often will wake up at 3:00 and may not go right back to bed be up drinking fluids and taking medication.  She has had issues with TMJ discomfort.  [facility] is her DME company.  An Epworth Sleepiness Scale score was calculated in the office today and this endorsed at 3 arguing against residual daytime sleepiness.  Since initiating CPAP therapy, she has noticed significant improvement in previous GERD symptoms as well as previous 3-4 time nocturia per evening. Her nocturnal palpitations have improved.  She denies any bruxism, restless legs, sleep paralysis, hypnagogic hallucinations or cataplectic events.  Current medications include Fioricet for migraine headaches as needed, Prozac for anxiety, levothyroxine for hypothyroidism, and Symbicort and as needed albuterol for asthma.  During her initial sleep consultation, I spent significant time with her and discussed sleep apnea and potential adverse cardiovascular consequences associated with untreated.  She felt  markedly improved since initiating CPAP therapy and previous nocturia had reduced from 4 times per night to approximately 1-2 times per night and there was resolution in her GERD.  Adjustments were made to her CPAP machine.  I discussed optimal sleep duration at 7 and 9 hours.  Since I last saw her, she has continued to use CPAP therapy but usage has been interrupted by migraines and hemiaplasia.  She believes she is sleeping well.  She typically goes to bed between 1130 and midnight and wakes up around 9 AM.  A download was obtained from June 24  through April 20, 2023.  Usage was suboptimal since he is dates she did not use CPAP on nights when she would have frequent migraine headaches that would last at times for days.  As result usage was 43%.  When used, however average use was 7 hours and 41 minutes.  At a pressure range of 12 to 18 cm of water, AHI was 2.1 with a 95th percentile pressure of 13 and maximum average pressure 13.6 cm of water.  She denies any residual daytime sleepiness.  An Epworth Sleepiness Scale score was calculated in the office today and this endorsed at 3 argue against residual daytime sleepiness.  She presents for follow-up evaluation.   Past Medical History:  Diagnosis Date   Fibromyalgia    Headache    Migraines    Osteopenia    Scoliosis     Past Surgical History:  Procedure Laterality Date   CYST EXCISION     1986   LAPAROTOMY     x 3    Current Medications: Outpatient Medications Prior to Visit  Medication Sig Dispense Refill   albuterol (VENTOLIN HFA) 108 (90 Base) MCG/ACT inhaler Inhale 2 puffs into the lungs every 4 (four) hours as needed.      butalbital-acetaminophen-caffeine (FIORICET) 50-325-40 MG tablet TAKE 1 TABLET BY MOUTH EVERY 6 HOURS AS NEEDED FOR HEADACHE 12 tablet 3   calcium carbonate (TUMS - DOSED IN MG ELEMENTAL CALCIUM) 500 MG chewable tablet Chew 1 tablet by mouth as needed for indigestion or heartburn.     fluticasone (FLONASE) 50 MCG/ACT nasal spray Place 2 sprays into both nostrils as needed.      levothyroxine (SYNTHROID) 50 MCG tablet Take 50 mcg by mouth every morning.     omeprazole (PRILOSEC) 10 MG capsule Take 10 mg by mouth daily.     paroxetine (PAXIL) 10 MG/5ML suspension Take 10 mg by mouth every morning.     SYMBICORT 160-4.5 MCG/ACT inhaler Inhale 2 puffs into the lungs daily.      tiZANidine (ZANAFLEX) 4 MG tablet TAKE 1/2 TABLET(2 MG) BY MOUTH EVERY 6 HOURS AS NEEDED FOR MUSCLE SPASMS 30 tablet 3   topiramate (TOPAMAX) 100 MG tablet Take 1 tablet (100 mg  total) by mouth at bedtime. 90 tablet 3   No facility-administered medications prior to visit.     Allergies:   Doxycycline and Penicillins   Social History   Socioeconomic History   Marital status: Divorced    Spouse name: Not on file   Number of children: 0   Years of education: college   Highest education level: Bachelor's degree (e.g., BA, AB, BS)  Occupational History   Occupation: Retired  Tobacco Use   Smoking status: Former    Current packs/day: 0.00    Average packs/day: 1 pack/day for 30.0 years (30.0 ttl pk-yrs)    Types: Cigarettes    Start date: 09/23/1985  Quit date: 09/24/2015    Years since quitting: 7.5   Smokeless tobacco: Never  Substance and Sexual Activity   Alcohol use: No    Alcohol/week: 0.0 standard drinks of alcohol   Drug use: No   Sexual activity: Not on file  Other Topics Concern   Not on file  Social History Narrative   Patient lives at home alone w/cat.   Patient is retired and has her B.S.    Caffeine - two cups daily.   Patient is divorced.    Patient has a college education.    Patient has no children.       Social Determinants of Health   Financial Resource Strain: Not on file  Food Insecurity: Not on file  Transportation Needs: Not on file  Physical Activity: Not on file  Stress: Not on file  Social Connections: Not on file    Socially she is divorced.  There is a previous 30-year tobacco history, fortunately she quit approximately 5 years ago.   Family History:  The patient's family history includes Brain cancer in her father; Heart attack in her paternal uncle; Ovarian cancer in her mother; Rheum arthritis in her sister.  Both parents are deceased, father had a glioblastoma and mother had ovarian cancer.  She has 1 deceased sister.  ROS General: Negative; No fevers, chills, or night sweats;  HEENT: Negative; No changes in vision or hearing, sinus congestion, difficulty swallowing Pulmonary: Asthma Cardiovascular:   palpitations GI: GERD GU: Negative; No dysuria, hematuria, or difficulty voiding Musculoskeletal: Negative; no myalgias, joint pain, or weakness Hematologic/Oncology: Negative; no easy bruising, bleeding Endocrine: Negative; no heat/cold intolerance; no diabetes Neuro: Negative; no changes in balance, headaches Skin: Negative; No rashes or skin lesions Psychiatric: Negative; No behavioral problems, depression Sleep: See HPI Other comprehensive 14 point system review is negative.   PHYSICAL EXAM:   VS:  BP 133/85 (BP Location: Right Arm, Patient Position: Sitting, Cuff Size: Normal)   Pulse 61   Ht 5\' 6"  (1.676 m)   Wt 112 lb 6.4 oz (51 kg)   SpO2 97%   BMI 18.14 kg/m     Repeat blood pressure by me was 130/80  Wt Readings from Last 3 Encounters:  04/21/23 112 lb 6.4 oz (51 kg)  12/01/22 190 lb (86.2 kg)  05/27/22 196 lb 8 oz (89.1 kg)    General: Alert, oriented, no distress.  Skin: normal turgor, no rashes, warm and dry HEENT: Normocephalic, atraumatic. Pupils equal round and reactive to light; sclera anicteric; extraocular muscles intact; Nose without nasal septal hypertrophy Mouth/Parynx benign; Mallinpatti scale 3 Neck: No JVD, no carotid bruits; normal carotid upstroke Lungs: clear to ausculatation and percussion; no wheezing or rales Chest wall: without tenderness to palpitation Heart: PMI not displaced, RRR, s1 s2 normal, 1/6 systolic murmur, no diastolic murmur, no rubs, gallops, thrills, or heaves Abdomen: soft, nontender; no hepatosplenomehaly, BS+; abdominal aorta nontender and not dilated by palpation. Back: no CVA tenderness Pulses 2+ Musculoskeletal: full range of motion, normal strength, no joint deformities Extremities: no clubbing cyanosis or edema, Homan's sign negative  Neurologic: grossly nonfocal; Cranial nerves grossly wnl Psychologic: Normal mood and affect   Studies/Labs Reviewed:   EKG Interpretation Date/Time:  Wednesday April 21 2023 10:32:38  EDT Ventricular Rate:  61 PR Interval:  174 QRS Duration:  82 QT Interval:  432 QTC Calculation: 434 R Axis:   -34  Text Interpretation: Normal sinus rhythm Left axis deviation Inferior infarct , age undetermined Poor R  wave progression No previous ECGs available Confirmed by Nicki Guadalajara ([id number]) on 04/25/2023 1:56:29 PM     The ekg ordered October 5,2022  demonstrates NSR at 70, LAE, low voltage, Q III, aVF., no ectopy  Recent Labs:    Latest Ref Rng & Units 07/04/2010    5:22 PM  BMP  Glucose 70 - 99 mg/dL 95   BUN 6 - 23 mg/dL 19   Creatinine 0.4 - 1.2 mg/dL 1.1   Sodium 604 - 540 mEq/L 145   Potassium 3.5 - 5.1 mEq/L 3.8   Chloride 96 - 112 mEq/L 119          No data to display             Latest Ref Rng & Units 07/04/2010    5:22 PM 07/04/2010    4:58 PM  CBC  WBC 4.0 - 10.5 K/uL  4.0   Hemoglobin 12.0 - 15.0 g/dL 98.1  19.1   Hematocrit 36.0 - 46.0 % 41.0  39.4   Platelets 150 - 400 K/uL  144    Lab Results  Component Value Date   MCV 95.0 07/04/2010   No results found for: "TSH" No results found for: "HGBA1C"   BNP No results found for: "BNP"  ProBNP No results found for: "PROBNP"   Lipid Panel  No results found for: "CHOL", "TRIG", "HDL", "CHOLHDL", "VLDL", "LDLCALC", "LDLDIRECT", "LABVLDL"   RADIOLOGY: No results found.   Additional studies/ records that were reviewed today include:     Patient Name: Stephanie Finley, Stephanie Finley Date: 07/20/2020 Gender: Female D.O.B: March 02, 1951 Age (years): 69 Referring Provider: Ronnald Ramp ONeal Height (inches): 64 Interpreting Physician: Nicki Guadalajara MD, ABSM Weight (lbs): 237 RPSGT: Dahlgren Sink BMI: 41 MRN: [id number] Neck Size: 15.00   CLINICAL INFORMATION Sleep Study Type: HST   Indication for sleep study: snoring, fatigue   Epworth Sleepiness Score: 3   SLEEP STUDY TECHNIQUE A multi-channel overnight portable sleep study was performed. The channels recorded were: nasal airflow,  thoracic respiratory movement, and oxygen saturation with a pulse oximetry. Snoring was also monitored.   MEDICATIONS albuterol (VENTOLIN HFA) 108 (90 Base) MCG/ACT inhaler butalbital-acetaminophen-caffeine (FIORICET) 50-325-40 MG tablet calcium carbonate (TUMS - DOSED IN MG ELEMENTAL CALCIUM) 500 MG chewable tablet FLUoxetine (PROZAC) 20 MG capsule fluticasone (FLONASE) 50 MCG/ACT nasal spray levothyroxine (SYNTHROID) 50 MCG tablet omeprazole (PRILOSEC) 10 MG capsule ondansetron (ZOFRAN ODT) 4 MG disintegrating tablet SYMBICORT 160-4.5 MCG/ACT inhaler Patient self administered medications include: N/A.   SLEEP ARCHITECTURE Patient was studied for 388.5 minutes. The sleep efficiency was 100.0 % and the patient was supine for 77.5%. The arousal index was 0.0 per hour.   RESPIRATORY PARAMETERS The overall AHI was 6.3 per hour, with a central apnea index of 0.0 per hour.   The oxygen nadir was 79% during sleep. Time spent <89% was 121 minutes.   CARDIAC DATA Mean heart rate during sleep was 76.4 bpm.   IMPRESSIONS - Mild obstructive sleep apnea occurred during this study (AHI 6.3/h). The severity during REM sleep cannot be assessed on this home study. - No significant central sleep apnea occurred during this study (CAI = 0.0/h). - Severe oxygen desaturation to a nadir of 79%. - Patient snored 0.1% during the sleep.   DIAGNOSIS - Obstructive Sleep Apnea (G47.33) - Nocturnal Hypoxemia (G47.36)   RECOMMENDATIONS - Therapeutic CPAP titration to determine optimal pressure required to alleviate sleep disordered breathing. If unable to schedule for an in-lab titration, initiate Auto-PAP at 6-15 cm  of water. - Effort should be made to optimize nasal and oropharyngeal patency. - Alterative to CPAP such as a customized oral appliance caan be considered. - Avoid alcohol, sedatives and other CNS depressants that may worsen sleep apnea and disrupt normal sleep architecture. - Sleep hygiene  should be reviewed to assess factors that may improve sleep quality. - Weight management (BMI 41) and regular exercise should be initiated or continued. - Return to Sleep Center to discuss the results of this study      CPAP TITRATION: 09/10/2020 CLINICAL INFORMATION The patient is referred for a CPAP titration to treat sleep apnea.   Date of HST: 10/23/20211: AHI 6.3/h, O2 nadir 79%.   SLEEP STUDY TECHNIQUE As per the AASM Manual for the Scoring of Sleep and Associated Events v2.3 (April 2016) with a hypopnea requiring 4% desaturations.   The channels recorded and monitored were frontal, central and occipital EEG, electrooculogram (EOG), submentalis EMG (chin), nasal and oral airflow, thoracic and abdominal wall motion, anterior tibialis EMG, snore microphone, electrocardiogram, and pulse oximetry. Continuous positive airway pressure (CPAP) was initiated at the beginning of the study and titrated to treat sleep-disordered breathing.   MEDICATIONS albuterol (VENTOLIN HFA) 108 (90 Base) MCG/ACT inhaler butalbital-acetaminophen-caffeine (FIORICET) 50-325-40 MG tablet calcium carbonate (TUMS - DOSED IN MG ELEMENTAL CALCIUM) 500 MG chewable tablet FLUoxetine (PROZAC) 20 MG capsule fluticasone (FLONASE) 50 MCG/ACT nasal spray levothyroxine (SYNTHROID) 50 MCG tablet omeprazole (PRILOSEC) 10 MG capsule ondansetron (ZOFRAN ODT) 4 MG disintegrating tablet SYMBICORT 160-4.5 MCG/ACT inhaler Medications self-administered by patient taken the night of the study : N/A   TECHNICIAN COMMENTS Comments added by technician: None Comments added by scorer: N/A   RESPIRATORY PARAMETERS Optimal PAP Pressure (cm):  14        AHI at Optimal Pressure (/hr):            0.0 Overall Minimal O2 (%):         86.0     Supine % at Optimal Pressure (%):    100 Minimal O2 at Optimal Pressure (%): 90.0        SLEEP ARCHITECTURE The study was initiated at 10:46:14 PM and ended at 4:53:34 AM.   Sleep onset time  was 27.8 minutes and the sleep efficiency was 70.4%%. The total sleep time was 258.5 minutes.   The patient spent 8.1%% of the night in stage N1 sleep, 72.5%% in stage N2 sleep, 8.5%% in stage N3 and 10.8% in REM.Stage REM latency was 134.0 minutes   Wake after sleep onset was 81.1. Alpha intrusion was absent. Supine sleep was 100.00%.   CARDIAC DATA The 2 lead EKG demonstrated sinus rhythm. The mean heart rate was 70.4 beats per minute. Other EKG findings include: None.   LEG MOVEMENT DATA The total Periodic Limb Movements of Sleep (PLMS) were 0. The PLMS index was 0.0. A PLMS index of <15 is considered normal in adults.   IMPRESSIONS - CPAP was initiated at 5 cm and was titrated to optimal PAP pressure 14 cm of water. AHI at 12 cm was 0.6/h with only 30 seconds of REM sleep and at 14 cm was 0 and O2 nadir at 90% with 11:48 minutes of REM sleep. - Central sleep apnea was not noted during this titration (CAI = 0.0/h). - Moderate oxygen desaturations were observed during this titration to a nadir of 86% at 9 cm of water. - No snoring was audible during this study. - No cardiac abnormalities were observed during this study. -  Clinically significant periodic limb movements were not noted during this study. Arousals associated with PLMs were rare.   DIAGNOSIS - Obstructive Sleep Apnea (G47.33)   RECOMMENDATIONS - Recommend an initial trial of CPAP Auto therapy with EPR of 3 at 12 - 18 cm H2O with heated humidification. A Medium size Fisher&Paykel Full Face Mask Simplus mask was used for the titration. - Effort should be made to optimize nasal and oropharyngeal patency. - Avoid alcohol, sedatives and other CNS depressants that may worsen sleep apnea and disrupt normal sleep architecture. - Sleep hygiene should be reviewed to assess factors that may improve sleep quality. - Weight management and regular exercise should be initiated or continued. - Recommend a download in 30 days and sleep  clinic evaluation after 4 weeks of therapy.     ASSESSMENT:    1. OSA (obstructive sleep apnea)   2. Palpitations   3. Chronic migraine w/o aura w/o status migrainosus, not intractable   4. Gastroesophageal reflux disease, unspecified whether esophagitis present   5. Hypothyroidism, unspecified type     PLAN:  Ms. Stephanie Finley is a very pleasant 71 year old female who has a history of fibromyalgia, asthma, GERD, hypothyroidism, as well as recent development of nocturnal palpitations.  She was found to have at least overall mild sleep apnea on her home study but during that study she had significant oxygen desaturation to 79% raising concern for much more severe sleep apnea during REM sleep which could not be assessed on the home study.  She subsequently underwent a CPAP titration study and was titrated up to an optimal pressure of 14 cm of water with REM sleep present.  Unfortunately, due to supply chain issues there was a significant delay in her receiving a new machine but she did receive the new ResMed air sense 11 CPAP auto unit with set up date on June 08, 2021.  When I saw her for my initial sleep consultation on July 02, 2021 I had a lengthy discussion with her and reviewed in detail normal sleep architecture and the potential adverse consequences of untreated sleep apnea.  I discussed the potential increased sympathetic tone nocturnally associated with untreated sleep apnea as a particular potential factor in nocturnal palpitations which may also be exacerbated by significant nocturnal hypoxemia.  I discussed its effect on blood pressure with potential elimination of the normal 20 mm diastolic dip typically observed with normal sleep.  I also discussed its potential effects on potential nocturnal ischemia both cardiac and cerebrovascular, increase incidence of atrial fibrillation and also its effects on glucose metabolism, inflammation, and GERD.  With CPAP initiation and she felt  significantly improved.  Her previous nocturia of 3-4 times per night was occurring approximately 1 time per night and she had significant improvement in prior nocturnal GERD.  Since I last saw her, she has used CPAP but usage has been limited by frequent episodes of longstanding migraine headaches where she gets hemiplegic.  As result she does not put a mask on when she has this headache.  I have suggested she obtain follow-up evaluation regarding her significant migraine history.  Presently, when she uses CPAP, average use is 7 hours and 41 minutes per night.  AHI is excellent at 2.1 with 95th percentile pressure of 13 and maximum average pressure 13.6.  Her device is set at a pressure range of 12 to 18 cm of water.  Presently, she is sleeping well with CPAP.  There is no residual daytime sleepiness.  Her blood  pressure today is stable on repeat by me 130/80.  She continues to be on levothyroxine for hypothyroidism and takes omeprazole for GERD.  She takes Paxil for anxiety.  She is no longer taking Topamax.  She sees Dr. [name] for primary care.  I will see her in April/May 2025 for follow-up evaluation.    Medication Adjustments/Labs and Tests Ordered: Current medicines are reviewed at length with the patient today.  Concerns regarding medicines are outlined above.  Medication changes, Labs and Tests ordered today are listed in the Patient Instructions below. Patient Instructions  Medication Instructions:  NO CHANGES *If you need a refill on your cardiac medications before your next appointment, please call your pharmacy*   Lab Work: NO CHANGES If you have labs (blood work) drawn today and your tests are completely normal, you will receive your results only by: MyChart Message (if you have MyChart) OR A paper copy in the mail If you have any lab test that is abnormal or we need to change your treatment, we will call you to review the  results.   Testing/Procedures: NONE   Follow-Up: At [facility], you and your health needs are our priority.  As part of our continuing mission to provide you with exceptional heart care, we have created designated Provider Care Teams.  These Care Teams include your primary Cardiologist (physician) and Advanced Practice Providers (APPs -  Physician Assistants and Nurse Practitioners) who all work together to provide you with the care you need, when you need it.  We recommend signing up for the patient portal called "MyChart".  Sign up information is provided on this After Visit Summary.  MyChart is used to connect with patients for Virtual Visits (Telemedicine).  Patients are able to view lab/test results, encounter notes, upcoming appointments, etc.  Non-urgent messages can be sent to your provider as well.   To learn more about what you can do with MyChart, go to [email or web address].    Your next appointment:   10 month(s)  Provider:   DR. Tresa Endo         Signed, Nicki Guadalajara, MD, [facility], ABSM American Board of Sleep Medicine   04/25/2023 2:01 PM    [facility] [address], [address], [place], [place]  [address] Phone: [phone]

## 2023-04-25 ENCOUNTER — Encounter: Payer: Self-pay | Admitting: Cardiovascular Disease

## 2023-04-28 DIAGNOSIS — G4733 Obstructive sleep apnea (adult) (pediatric): Secondary | ICD-10-CM | POA: Diagnosis not present

## 2023-04-29 DIAGNOSIS — E7800 Pure hypercholesterolemia, unspecified: Secondary | ICD-10-CM | POA: Diagnosis not present

## 2023-04-29 DIAGNOSIS — E039 Hypothyroidism, unspecified: Secondary | ICD-10-CM | POA: Diagnosis not present

## 2023-04-29 DIAGNOSIS — Z79899 Other long term (current) drug therapy: Secondary | ICD-10-CM | POA: Diagnosis not present

## 2023-05-03 DIAGNOSIS — Z1211 Encounter for screening for malignant neoplasm of colon: Secondary | ICD-10-CM | POA: Diagnosis not present

## 2023-05-06 DIAGNOSIS — M810 Age-related osteoporosis without current pathological fracture: Secondary | ICD-10-CM | POA: Diagnosis not present

## 2023-05-06 DIAGNOSIS — R109 Unspecified abdominal pain: Secondary | ICD-10-CM | POA: Diagnosis not present

## 2023-05-06 DIAGNOSIS — E7800 Pure hypercholesterolemia, unspecified: Secondary | ICD-10-CM | POA: Diagnosis not present

## 2023-05-06 DIAGNOSIS — Z23 Encounter for immunization: Secondary | ICD-10-CM | POA: Diagnosis not present

## 2023-05-06 DIAGNOSIS — I700 Atherosclerosis of aorta: Secondary | ICD-10-CM | POA: Diagnosis not present

## 2023-05-06 DIAGNOSIS — R5383 Other fatigue: Secondary | ICD-10-CM | POA: Diagnosis not present

## 2023-05-06 DIAGNOSIS — Z9181 History of falling: Secondary | ICD-10-CM | POA: Diagnosis not present

## 2023-05-06 DIAGNOSIS — Z0000 Encounter for general adult medical examination without abnormal findings: Secondary | ICD-10-CM | POA: Diagnosis not present

## 2023-05-06 DIAGNOSIS — J4520 Mild intermittent asthma, uncomplicated: Secondary | ICD-10-CM | POA: Diagnosis not present

## 2023-06-02 NOTE — Progress Notes (Unsigned)
PATIENT: Stephanie Finley DOB: 02/28/1951  REASON FOR VISIT: Follow up for migraine HISTORY FROM: Patient PRIMARY NEUROLOGIST: Dr. [name]  HISTORY  Stephanie Finley is a 72 year old female, seen in request by her primary care physician Dr. [name] for evaluation of chronic migraine, initial evaluation was on July 26, 2019.   I have reviewed and summarized the referring note from the referring physician.  She had past medical history of chronic migraine since childhood, her typical migraine lateralized retro-orbital area severe pounding headache with associated light, noise sensitivity, nauseous, lasting for few hours to 1 day, she used to have migraines about couple times each month, but since May 2020, she began to have increased frequency of migraine, 2-3 times each week, sometimes her migraine preceded by loss of vision in the right visual field,   Previously she took Topamax as migraine prevention, which does help her migraine, she has a questionable history of kidney stone, she also complained side effect of Topamax weight gain, feeling fatigued,   For abortive treatment, previously she has tried different triptans, Imitrex, Maxalt, does not help her headache, she is not taking Fioricet as needed, which is helpful.   Update January 18, 2020 SS: When last seen, was started on Lamictal working up to 100 mg twice a day, tolerating well.  Her headaches are doing really well.  On average having 1 migraine a week, this is the " gold standard" for her.  She will take Fioricet with excellent benefit.  She is pleased how well her headaches are doing.  Has history of anxiety, fibromyalgia, IBS, scoliosis.  She presents today unaccompanied.   Update November 11, 2020 SS: Here today via VV. No longer taking Lamictal, she thinks she doesn't need a daily preventative med. Had bad end of Oct/1st of Nov, horrible with allergies, relative died of COVID. Since middle of November-relatively migraine free,  except for occasional migraine 1 every 2 weeks, does have 1 today. Will take 1/2 tablet Fioricet and Tizanidine, takes other half of Fioricet later if needed. This is best combo she has ever had. Is working out, has lost 10 lbs over last 3 months. Sleep study has shown OSA, will be started CPAP.   Update November 11, 2021 SS: Here today alone, had bad month in December/January, that was a cluster, related to holidays/weather/UTI/bronchitis. Getting used to CPAP. Takes Fioricet (1/2 tablet), tizanidine as combo for headaches. Of recent having to take 2-3 tablets a week. Still doesn't want a preventative, in past Topamax worked best for her.   Update May 27, 2022 SS: Since last seen more migraines, cluster lasted for 15 days in July and August. Had ear infection afterwards. Thinking about preventative. Describes typical migraine right sided, tingling, stiffness to right arm. Tension to right shoulder. Extends to right leg with symptoms. Took Fioricet, but stopped it, worried about rebound headache. Had extremely stressful summer. Here to discuss preventative. Tried and failed: Topamax, propranolol, Imitrex, Maxalt. Migraines are typical pattern for her.   Update December 01, 2022 SS: We started Topamax at last visit, 50 mg at bedtime. No side effects. Lately 1 migraine a week, this is a good # for her. Taking Fioricet, it works great only taking 1/2 Fioricet and 1/2 tizanidine, lay down. Stephanie Finley was too expensive. Switched from Prozac to Paxil. She is pleased with her migraine management. Still has right sided tingling with migraines.   Update June 03, 2023 SS: Topamax 50 mg upset her stomach, never tried 100 mg, fortunately had  been doing very well. Didn't have migraine in 3 months. Has Fioricet if needed, more migraines past 2 weeks due to plumbing issue in apartment building, loud noise. Health has been good. Has IBS. Stopped her Paxil due to concern for side  effect, may go back to Prozac.    REVIEW  OF SYSTEMS: Out of a complete 14 system review of symptoms, the patient complains only of the following symptoms, and all other reviewed systems are negative.  See HPI  ALLERGIES: Allergies  Allergen Reactions   Doxycycline Diarrhea   Penicillins Hives   Topamax [Topiramate] Diarrhea   Paxil [Paroxetine] Anxiety and Palpitations    HOME MEDICATIONS: Outpatient Medications Prior to Visit  Medication Sig Dispense Refill   albuterol (VENTOLIN HFA) 108 (90 Base) MCG/ACT inhaler Inhale 2 puffs into the lungs every 4 (four) hours as needed.      butalbital-acetaminophen-caffeine (FIORICET) 50-325-40 MG tablet TAKE 1 TABLET BY MOUTH EVERY 6 HOURS AS NEEDED FOR HEADACHE 12 tablet 3   calcium carbonate (TUMS - DOSED IN MG ELEMENTAL CALCIUM) 500 MG chewable tablet Chew 1 tablet by mouth as needed for indigestion or heartburn.     fluticasone (FLONASE) 50 MCG/ACT nasal spray Place 2 sprays into both nostrils as needed.      levothyroxine (SYNTHROID) 50 MCG tablet Take 50 mcg by mouth every morning.     omeprazole (PRILOSEC) 10 MG capsule Take 10 mg by mouth daily as needed.     SYMBICORT 160-4.5 MCG/ACT inhaler Inhale 2 puffs into the lungs daily as needed.     tiZANidine (ZANAFLEX) 4 MG tablet TAKE 1/2 TABLET(2 MG) BY MOUTH EVERY 6 HOURS AS NEEDED FOR MUSCLE SPASMS 30 tablet 3   paroxetine (PAXIL) 10 MG/5ML suspension Take 10 mg by mouth every morning.     topiramate (TOPAMAX) 100 MG tablet Take 1 tablet (100 mg total) by mouth at bedtime. 90 tablet 3   No facility-administered medications prior to visit.    PAST MEDICAL HISTORY: Past Medical History:  Diagnosis Date   Fibromyalgia    Headache    Migraines    Osteopenia    Scoliosis     PAST SURGICAL HISTORY: Past Surgical History:  Procedure Laterality Date   CYST EXCISION     1986   LAPAROTOMY     x 3    FAMILY HISTORY: Family History  Problem Relation Age of Onset   Ovarian cancer Mother    Brain cancer Father    Rheum  arthritis Sister    Heart attack Paternal Uncle     SOCIAL HISTORY: Social History   Socioeconomic History   Marital status: Divorced    Spouse name: Not on file   Number of children: 0   Years of education: college   Highest education level: Bachelor's degree (e.g., BA, AB, BS)  Occupational History   Occupation: Retired  Tobacco Use   Smoking status: Former    Current packs/day: 0.00    Average packs/day: 1 pack/day for 30.0 years (30.0 ttl pk-yrs)    Types: Cigarettes    Start date: 09/23/1985    Quit date: 09/24/2015    Years since quitting: 7.6   Smokeless tobacco: Never  Substance and Sexual Activity   Alcohol use: No    Alcohol/week: 0.0 standard drinks of alcohol   Drug use: No   Sexual activity: Not on file  Other Topics Concern   Not on file  Social History Narrative   Patient lives at home alone w/cat.  Patient is retired and has her B.S.    Caffeine - two cups daily.   Patient is divorced.    Patient has a college education.    Patient has no children.       Social Determinants of Health   Financial Resource Strain: Not on file  Food Insecurity: Not on file  Transportation Needs: Not on file  Physical Activity: Not on file  Stress: Not on file  Social Connections: Not on file  Intimate Partner Violence: Not on file   PHYSICAL EXAM  Vitals:   06/03/23 1330  BP: (!) 159/84  Pulse: 72  SpO2: 96%  Weight: 185 lb (83.9 kg)  Height: 5\' 6"  (1.676 m)   Body mass index is 29.86 kg/m.  Generalized: Well developed, in no acute distress   Neurological examination  Mentation: Alert oriented to time, place, history taking. Follows all commands speech and language fluent Cranial nerve II-XII: Pupils were equal round reactive to light. Extraocular movements were full, visual field were full on confrontational test. Facial sensation and strength were normal.  Head turning and shoulder shrug  were normal and symmetric. Motor: The motor testing reveals 5  over 5 strength of all 4 extremities. Good symmetric motor tone is noted throughout.  Sensory: Sensory testing is intact to soft touch on all 4 extremities. No evidence of extinction is noted.  Coordination: Cerebellar testing reveals good finger-nose-finger and heel-to-shin bilaterally.  Gait and station: Gait is normal.  Reflexes: Deep tendon reflexes are symmetric and normal bilaterally.   DIAGNOSTIC DATA (LABS, IMAGING, TESTING) - I reviewed patient records, labs, notes, testing and imaging myself where available.  Lab Results  Component Value Date   WBC 4.0 07/04/2010   HGB 13.9 07/04/2010   HCT 41.0 07/04/2010   MCV 95.0 07/04/2010   PLT 144 (L) 07/04/2010      Component Value Date/Time   NA 145 07/04/2010 1722   K 3.8 07/04/2010 1722   CL 119 (H) 07/04/2010 1722   GLUCOSE 95 07/04/2010 1722   BUN 19 07/04/2010 1722   CREATININE 1.1 07/04/2010 1722   No results found for: "CHOL", "HDL", "LDLCALC", "LDLDIRECT", "TRIG", "CHOLHDL" No results found for: "HGBA1C" No results found for: "VITAMINB12" No results found for: "TSH"  ASSESSMENT AND PLAN 72 y.o. year old female   1.  Chronic migraine headache  -Migraines have been under very good control, wishes to hold off on preventative -Continue Fioricet as needed for acute headache, may combine with tizanidine.  Do not take Fioricet more than 2 to 3 days a week due to concern for rebound headache. -Tried and failed: Topamax, propranolol, Imitrex, Maxalt, Ubrelvy used too expensive, prefer to not use triptans due to age and neurological symptoms with migraines  -Next steps: Zonegran, Effexor, CGRP -Follow up in 1 year or sooner if needed  Meds ordered this encounter  Medications   butalbital-acetaminophen-caffeine (FIORICET) 50-325-40 MG tablet    Sig: TAKE 1 TABLET BY MOUTH EVERY 6 HOURS AS NEEDED FOR HEADACHE    Dispense:  12 tablet    Refill:  3    This is a 30 day rx    Margie Ege, AGNP-C, DNP 06/03/2023, 2:15  PM [facility] [address] [place], [place] [address] [phone]

## 2023-06-03 ENCOUNTER — Ambulatory Visit: Payer: Medicare Other | Admitting: Neurology

## 2023-06-03 ENCOUNTER — Encounter: Payer: Self-pay | Admitting: Neurology

## 2023-06-03 VITALS — BP 159/84 | HR 72 | Ht 66.0 in | Wt 185.0 lb

## 2023-06-03 DIAGNOSIS — G43709 Chronic migraine without aura, not intractable, without status migrainosus: Secondary | ICD-10-CM

## 2023-06-03 MED ORDER — BUTALBITAL-APAP-CAFFEINE 50-325-40 MG PO TABS: ORAL_TABLET | ORAL | 3 refills | Status: DC

## 2023-06-22 DIAGNOSIS — E7800 Pure hypercholesterolemia, unspecified: Secondary | ICD-10-CM | POA: Diagnosis not present

## 2023-07-06 DIAGNOSIS — E7800 Pure hypercholesterolemia, unspecified: Secondary | ICD-10-CM | POA: Diagnosis not present

## 2023-08-04 DIAGNOSIS — E7800 Pure hypercholesterolemia, unspecified: Secondary | ICD-10-CM | POA: Diagnosis not present

## 2023-08-04 DIAGNOSIS — K589 Irritable bowel syndrome without diarrhea: Secondary | ICD-10-CM | POA: Diagnosis not present

## 2023-08-04 DIAGNOSIS — E039 Hypothyroidism, unspecified: Secondary | ICD-10-CM | POA: Diagnosis not present

## 2023-11-08 DIAGNOSIS — M810 Age-related osteoporosis without current pathological fracture: Secondary | ICD-10-CM | POA: Diagnosis not present

## 2023-11-08 DIAGNOSIS — E039 Hypothyroidism, unspecified: Secondary | ICD-10-CM | POA: Diagnosis not present

## 2023-11-08 DIAGNOSIS — G43709 Chronic migraine without aura, not intractable, without status migrainosus: Secondary | ICD-10-CM | POA: Diagnosis not present

## 2023-11-08 DIAGNOSIS — E7800 Pure hypercholesterolemia, unspecified: Secondary | ICD-10-CM | POA: Diagnosis not present

## 2023-11-08 DIAGNOSIS — E559 Vitamin D deficiency, unspecified: Secondary | ICD-10-CM | POA: Diagnosis not present

## 2023-12-02 ENCOUNTER — Encounter: Payer: Self-pay | Admitting: Neurology

## 2023-12-02 DIAGNOSIS — G43709 Chronic migraine without aura, not intractable, without status migrainosus: Secondary | ICD-10-CM

## 2023-12-09 NOTE — Telephone Encounter (Signed)
 Pt returned call. Please call back when available.

## 2023-12-16 ENCOUNTER — Telehealth: Payer: Self-pay

## 2023-12-16 NOTE — Telephone Encounter (Signed)
 Call to patient and she is having trouble getting her tizanidine. From her description, they must have changed manufactures and the new one is causing her terrible IBS.  Advised I would call pharmacy and see if I can determine what happened.

## 2023-12-16 NOTE — Telephone Encounter (Signed)
 Pt returned call. Please call back when available.

## 2024-01-03 MED ORDER — TIZANIDINE HCL 4 MG PO TABS: ORAL_TABLET | ORAL | 3 refills | Status: AC

## 2024-01-03 NOTE — Addendum Note (Signed)
 Addended by: Lenn Cal on: 01/03/2024 01:59 PM   Modules accepted: Orders

## 2024-02-15 ENCOUNTER — Ambulatory Visit: Payer: Medicare Other | Admitting: Cardiovascular Disease

## 2024-03-21 ENCOUNTER — Ambulatory Visit: Admitting: Cardiovascular Disease

## 2024-03-27 DIAGNOSIS — E039 Hypothyroidism, unspecified: Secondary | ICD-10-CM | POA: Diagnosis not present

## 2024-03-27 DIAGNOSIS — E7800 Pure hypercholesterolemia, unspecified: Secondary | ICD-10-CM | POA: Diagnosis not present

## 2024-04-27 DIAGNOSIS — E039 Hypothyroidism, unspecified: Secondary | ICD-10-CM | POA: Diagnosis not present

## 2024-04-27 DIAGNOSIS — E7800 Pure hypercholesterolemia, unspecified: Secondary | ICD-10-CM | POA: Diagnosis not present

## 2024-05-05 DIAGNOSIS — E039 Hypothyroidism, unspecified: Secondary | ICD-10-CM | POA: Diagnosis not present

## 2024-05-05 DIAGNOSIS — E559 Vitamin D deficiency, unspecified: Secondary | ICD-10-CM | POA: Diagnosis not present

## 2024-05-05 DIAGNOSIS — R5383 Other fatigue: Secondary | ICD-10-CM | POA: Diagnosis not present

## 2024-05-05 DIAGNOSIS — E7800 Pure hypercholesterolemia, unspecified: Secondary | ICD-10-CM | POA: Diagnosis not present

## 2024-05-10 DIAGNOSIS — M810 Age-related osteoporosis without current pathological fracture: Secondary | ICD-10-CM | POA: Diagnosis not present

## 2024-05-10 DIAGNOSIS — R5383 Other fatigue: Secondary | ICD-10-CM | POA: Diagnosis not present

## 2024-05-10 DIAGNOSIS — Z23 Encounter for immunization: Secondary | ICD-10-CM | POA: Diagnosis not present

## 2024-05-10 DIAGNOSIS — E039 Hypothyroidism, unspecified: Secondary | ICD-10-CM | POA: Diagnosis not present

## 2024-05-10 DIAGNOSIS — R030 Elevated blood-pressure reading, without diagnosis of hypertension: Secondary | ICD-10-CM | POA: Diagnosis not present

## 2024-05-10 DIAGNOSIS — E7800 Pure hypercholesterolemia, unspecified: Secondary | ICD-10-CM | POA: Diagnosis not present

## 2024-05-10 DIAGNOSIS — J4520 Mild intermittent asthma, uncomplicated: Secondary | ICD-10-CM | POA: Diagnosis not present

## 2024-05-10 DIAGNOSIS — E559 Vitamin D deficiency, unspecified: Secondary | ICD-10-CM | POA: Diagnosis not present

## 2024-05-10 DIAGNOSIS — Z0000 Encounter for general adult medical examination without abnormal findings: Secondary | ICD-10-CM | POA: Diagnosis not present

## 2024-05-10 DIAGNOSIS — Z1211 Encounter for screening for malignant neoplasm of colon: Secondary | ICD-10-CM | POA: Diagnosis not present

## 2024-05-17 ENCOUNTER — Other Ambulatory Visit (HOSPITAL_BASED_OUTPATIENT_CLINIC_OR_DEPARTMENT_OTHER): Payer: Self-pay | Admitting: Family Medicine

## 2024-05-17 DIAGNOSIS — M810 Age-related osteoporosis without current pathological fracture: Secondary | ICD-10-CM

## 2024-05-19 ENCOUNTER — Telehealth: Payer: Self-pay

## 2024-05-19 DIAGNOSIS — G43709 Chronic migraine without aura, not intractable, without status migrainosus: Secondary | ICD-10-CM

## 2024-05-24 ENCOUNTER — Other Ambulatory Visit: Payer: Self-pay | Admitting: Neurology

## 2024-05-25 NOTE — Telephone Encounter (Signed)
 Requested Prescriptions   Pending Prescriptions Disp Refills   butalbital -acetaminophen-caffeine  (FIORICET) 50-325-40 MG tablet [Pharmacy Med Name: BUT/ACETAMINOPHEN/CAFF 50-325-40 TB] 12 tablet     Sig: TAKE 1 TABLET BY MOUTH EVERY 6 HOURS AS NEEDED FOR HEADACHE   Last seen 06/03/23 Next appt 06/08/24  Dispenses   Dispensed Days Supply Quantity Provider Pharmacy  BUT/ACETAMINOPHEN/CAFF 50-325-40 TB 02/10/2024 3 12 each Gayland Lauraine PARAS, NP [facility] DR[facility]ORE #...  BUT/ACETAMINOPHEN/CAFF 50-325-40 TB 12/02/2023 3 12 each Gayland Lauraine PARAS, NP [facility] DR[facility] STORE #...  BUT/ACETAMINOPHEN/CAFF 50-325-40 TB 08/12/2023 3 12 each Gayland Lauraine PARAS, NP [facility] DRUG STORE #...  BUT/ACETAMINOPHEN/CAFF 50-325-40 TB 06/03/2023 3 12 each Gayland Lauraine PARAS, NP [facility] DRUG STORE #.[email or web address]

## 2024-05-28 DIAGNOSIS — E039 Hypothyroidism, unspecified: Secondary | ICD-10-CM | POA: Diagnosis not present

## 2024-05-28 DIAGNOSIS — E7800 Pure hypercholesterolemia, unspecified: Secondary | ICD-10-CM | POA: Diagnosis not present

## 2024-06-08 ENCOUNTER — Encounter: Payer: Self-pay | Admitting: Neurology

## 2024-06-08 ENCOUNTER — Ambulatory Visit: Payer: Medicare Other | Admitting: Neurology

## 2024-06-08 VITALS — BP 152/88 | HR 82 | Wt 195.0 lb

## 2024-06-08 DIAGNOSIS — G43709 Chronic migraine without aura, not intractable, without status migrainosus: Secondary | ICD-10-CM

## 2024-06-08 MED ORDER — PROPRANOLOL HCL ER 60 MG PO CP24: 60.0000 mg | ORAL_CAPSULE | Freq: Every day | ORAL | 5 refills | Status: AC

## 2024-06-08 NOTE — Progress Notes (Signed)
 PATIENT: Stephanie Finley DOB: 01/23/1951  REASON FOR VISIT: Follow up for migraine HISTORY FROM: Patient PRIMARY NEUROLOGIST: Dr. Onita  HISTORY  Stephanie Finley is a 73 year old female, seen in request by her primary care physician Dr. Chrystal Collar for evaluation of chronic migraine, initial evaluation was on July 26, 2019.   I have reviewed and summarized the referring note from the referring physician.  She had past medical history of chronic migraine since childhood, her typical migraine lateralized retro-orbital area severe pounding headache with associated light, noise sensitivity, nauseous, lasting for few hours to 1 day, she used to have migraines about couple times each month, but since May 2020, she began to have increased frequency of migraine, 2-3 times each week, sometimes her migraine preceded by loss of vision in the right visual field,   Previously she took Topamax  as migraine prevention, which does help her migraine, she has a questionable history of kidney stone, she also complained side effect of Topamax  weight gain, feeling fatigued,   For abortive treatment, previously she has tried different triptans, Imitrex, Maxalt, does not help her headache, she is not taking Fioricet as needed, which is helpful.   Update January 18, 2020 SS: When last seen, was started on Lamictal  working up to 100 mg twice a day, tolerating well.  Her headaches are doing really well.  On average having 1 migraine a week, this is the  gold standard for her.  She will take Fioricet with excellent benefit.  She is pleased how well her headaches are doing.  Has history of anxiety, fibromyalgia, IBS, scoliosis.  She presents today unaccompanied.   Update November 11, 2020 SS: Here today via VV. No longer taking Lamictal , she thinks she doesn't need a daily preventative med. Had bad end of Oct/1st of Nov, horrible with allergies, relative died of COVID. Since middle of November-relatively migraine free,  except for occasional migraine 1 every 2 weeks, does have 1 today. Will take 1/2 tablet Fioricet and Tizanidine , takes other half of Fioricet later if needed. This is best combo she has ever had. Is working out, has lost 10 lbs over last 3 months. Sleep study has shown OSA, will be started CPAP.   Update November 11, 2021 SS: Here today alone, had bad month in December/January, that was a cluster, related to holidays/weather/UTI/bronchitis. Getting used to CPAP. Takes Fioricet (1/2 tablet), tizanidine  as combo for headaches. Of recent having to take 2-3 tablets a week. Still doesn't want a preventative, in past Topamax  worked best for her.   Update May 27, 2022 SS: Since last seen more migraines, cluster lasted for 15 days in July and August. Had ear infection afterwards. Thinking about preventative. Describes typical migraine right sided, tingling, stiffness to right arm. Tension to right shoulder. Extends to right leg with symptoms. Took Fioricet, but stopped it, worried about rebound headache. Had extremely stressful summer. Here to discuss preventative. Tried and failed: Topamax , propranolol , Imitrex, Maxalt. Migraines are typical pattern for her.   Update December 01, 2022 SS: We started Topamax  at last visit, 50 mg at bedtime. No side effects. Lately 1 migraine a week, this is a good # for her. Taking Fioricet, it works great only taking 1/2 Fioricet and 1/2 tizanidine , lay down. Ubrelvy  was too expensive. Switched from Prozac to Paxil. She is pleased with her migraine management. Still has right sided tingling with migraines.   Update June 03, 2023 SS: Topamax  50 mg upset her stomach, never tried 100 mg, fortunately had  been doing very well. Didn't have migraine in 3 months. Has Fioricet if needed, more migraines past 2 weeks due to plumbing issue in apartment building, loud noise. Health has been good. Has IBS. Stopped her Paxil due to concern for side  effect, may go back to Prozac.    Update  June 08, 2024 SS: reports about 1 migraine a week on average. Takes Fioricet, works excellent, only takes 1/2 tablet. Will add in tizanidine  1/2 tablet. Health has been good. Had to increase her Synthroid last month, could have correlated with more migraine in July. BP has been high, tried Norvasc, made her too sleepy. Previously in [place] , she was placed on propranolol , dropped her BP too low, helped her headaches.   REVIEW OF SYSTEMS: Out of a complete 14 system review of symptoms, the patient complains only of the following symptoms, and all other reviewed systems are negative.  See HPI  ALLERGIES: Allergies  Allergen Reactions   Doxycycline Diarrhea   Penicillins Hives   Topamax  [Topiramate ] Diarrhea   Paxil [Paroxetine] Anxiety and Palpitations    HOME MEDICATIONS: Outpatient Medications Prior to Visit  Medication Sig Dispense Refill   albuterol (VENTOLIN HFA) 108 (90 Base) MCG/ACT inhaler Inhale 2 puffs into the lungs every 4 (four) hours as needed.      butalbital -acetaminophen-caffeine  (FIORICET) 50-325-40 MG tablet TAKE 1 TABLET BY MOUTH EVERY 6 HOURS AS NEEDED FOR HEADACHE 12 tablet 3   calcium carbonate (TUMS - DOSED IN MG ELEMENTAL CALCIUM) 500 MG chewable tablet Chew 1 tablet by mouth as needed for indigestion or heartburn.     fluticasone (FLONASE) 50 MCG/ACT nasal spray Place 2 sprays into both nostrils as needed.      levothyroxine (SYNTHROID) 50 MCG tablet Take 50 mcg by mouth every morning.     omeprazole (PRILOSEC) 10 MG capsule Take 10 mg by mouth daily as needed.     SYMBICORT 160-4.5 MCG/ACT inhaler Inhale 2 puffs into the lungs daily as needed.     tiZANidine  (ZANAFLEX ) 4 MG tablet TAKE 1/2 TABLET(2 MG) BY MOUTH EVERY 6 HOURS AS NEEDED FOR MUSCLE SPASMS 30 tablet 3   No facility-administered medications prior to visit.    PAST MEDICAL HISTORY: Past Medical History:  Diagnosis Date   Fibromyalgia    Headache    Migraines    Osteopenia    Scoliosis      PAST SURGICAL HISTORY: Past Surgical History:  Procedure Laterality Date   CYST EXCISION     1986   LAPAROTOMY     x 3    FAMILY HISTORY: Family History  Problem Relation Age of Onset   Ovarian cancer Mother    Brain cancer Father    Rheum arthritis Sister    Heart attack Paternal Uncle     SOCIAL HISTORY: Social History   Socioeconomic History   Marital status: Divorced    Spouse name: Not on file   Number of children: 0   Years of education: college   Highest education level: Bachelor's degree (e.g., BA, AB, BS)  Occupational History   Occupation: Retired  Tobacco Use   Smoking status: Former    Current packs/day: 0.00    Average packs/day: 1 pack/day for 30.0 years (30.0 ttl pk-yrs)    Types: Cigarettes    Start date: 09/23/1985    Quit date: 09/24/2015    Years since quitting: 8.7   Smokeless tobacco: Never  Substance and Sexual Activity   Alcohol use: No    Alcohol/week:  0.0 standard drinks of alcohol   Drug use: No   Sexual activity: Not on file  Other Topics Concern   Not on file  Social History Narrative   Patient lives at home alone w/cat.   Patient is retired and has her B.S.    Caffeine  - two cups daily.   Patient is divorced.    Patient has a college education.    Patient has no children.       Social Drivers of Corporate investment banker Strain: Not on file  Food Insecurity: Not on file  Transportation Needs: Not on file  Physical Activity: Not on file  Stress: Not on file  Social Connections: Not on file  Intimate Partner Violence: Not on file   PHYSICAL EXAM  Vitals:   06/08/24 1411  BP: (!) 152/88  Pulse: 82  SpO2: 98%  Weight: 195 lb (88.5 kg)   Body mass index is 31.47 kg/m.  Generalized: Well developed, in no acute distress   Neurological examination  Mentation: Alert oriented to time, place, history taking. Follows all commands speech and language fluent Cranial nerve II-XII: Pupils were equal round reactive to  light. Extraocular movements were full, visual field were full on confrontational test. Facial sensation and strength were normal.  Head turning and shoulder shrug  were normal and symmetric. Motor: The motor testing reveals 5 over 5 strength of all 4 extremities. Good symmetric motor tone is noted throughout.  Sensory: Sensory testing is intact to soft touch on all 4 extremities. No evidence of extinction is noted.  Coordination: Cerebellar testing reveals good finger-nose-finger and heel-to-shin bilaterally.  Gait and station: Gait is normal.  Reflexes: Deep tendon reflexes are symmetric and normal bilaterally.   DIAGNOSTIC DATA (LABS, IMAGING, TESTING) - I reviewed patient records, labs, notes, testing and imaging myself where available.  Lab Results  Component Value Date   WBC 4.0 07/04/2010   HGB 13.9 07/04/2010   HCT 41.0 07/04/2010   MCV 95.0 07/04/2010   PLT 144 (L) 07/04/2010      Component Value Date/Time   NA 145 07/04/2010 1722   K 3.8 07/04/2010 1722   CL 119 (H) 07/04/2010 1722   GLUCOSE 95 07/04/2010 1722   BUN 19 07/04/2010 1722   CREATININE 1.1 07/04/2010 1722   No results found for: CHOL, HDL, LDLCALC, LDLDIRECT, TRIG, CHOLHDL No results found for: YHAJ8R No results found for: VITAMINB12 No results found for: TSH  ASSESSMENT AND PLAN 73 y.o. year old female   1.  Chronic migraine headache  -About 1 migraine weekly - Mentions trying propranolol , she was previously on propranolol  that significantly helped her headaches, she had to stop it due to low blood pressure.  Recently her blood pressure has been high she was tried on amlodipine but had side effects.  Would like to try propranolol  for migraine prevention with added benefit of BP - Start Inderal  LA 60 mg daily, discussed side effects, monitor BP/HR at home  -Continue Fioricet as needed for acute headache, may combine with tizanidine .  Do not take Fioricet more than 2 to 3 days a week due  to concern for rebound headache. -Tried and failed: Topamax , propranolol , Imitrex, Maxalt, Ubrelvy  used too expensive, prefer to not use triptans due to age and neurological symptoms with migraines  -Next steps: Zonegran, Effexor, CGRP -Follow up in 6 months or sooner if needed  Meds ordered this encounter  Medications   propranolol  ER (INDERAL  LA) 60 MG 24 hr capsule  Sig: Take 1 capsule (60 mg total) by mouth daily.    Dispense:  30 capsule    Refill:  5   Lauraine Born, [place], DNP 06/08/2024, 2:36 PM [facility] [address], [address] [place], [place] [address] [phone]

## 2024-06-08 NOTE — Patient Instructions (Signed)
 Great to see you today Stephanie Finley! Start Inderal  LA 60 mg daily.  Please monitor your heart rate and blood pressure at home.  Watch out for worsening depression, fatigue, shortness of breath.  Continue Fioricet as needed for acute headache, try not to take more than 2 to 3 days a week to prevent rebound headache.  Follow-up in 6 months.  Reach out sooner if needed.  Thanks!!

## 2024-06-22 ENCOUNTER — Telehealth: Payer: Self-pay | Admitting: Pharmacist

## 2024-06-22 NOTE — Progress Notes (Signed)
   06/22/2024  Patient ID: Stephanie Finley, female   DOB: 01/13/1951, 73 y.o.   MRN: [id number]  Received referral from Dr. Chrystal regarding migraine medication cost concerns.  Completed monthly med cost analysis with insurance plan:  No charge: Alendronate, amlodipine, Fluoxetine, Flonase, Levothyroxine, Omeprazole  $5 charge: Albuterol inhaler as needed, dicyclomine as needed, Propranolol  ER 60mg  (recently got 90DS)  $2.72 foTizanidine  15 tablets and $4.67 for Fioricet  ExtraHelp through Medicare would not help as generic copays for Propranolol  ER would be about the same, along with the Tizanidine  and Fioricet (max of $4.90).   **Only option regarding the Propranolol  would be switching to 40mg  tablet twice a day for $0 monthly as the tablets have no charge  Called and spoke with the patient on the phone. No longer taking Propranolol  ER as it upsets her stomach. Has started the amlodipine 5mg  instead and doing well at this time. No side effects of concern currently- will continue to check BP readings and aware she may have to double the dose to 10mg  in the future if her systolic does not drop <140    Aloysius Lewis, PharmD [facility]  Phone Number: [phone]

## 2024-06-27 DIAGNOSIS — E039 Hypothyroidism, unspecified: Secondary | ICD-10-CM | POA: Diagnosis not present

## 2024-06-27 DIAGNOSIS — E7800 Pure hypercholesterolemia, unspecified: Secondary | ICD-10-CM | POA: Diagnosis not present

## 2024-09-11 ENCOUNTER — Other Ambulatory Visit: Payer: Self-pay | Admitting: Neurology

## 2024-09-13 NOTE — Telephone Encounter (Signed)
 Last seen on 06/08/24 Follow up scheduled on 01/18/25   Rx was just filled on    Dispensed Days Supply Quantity Provider Pharmacy  PROPRANOLOL  ER 60MG  CAPSULES 09/03/2024 90 90 each Gayland Lauraine PARAS, NP [facility] ST[facility] #.      Rx denied

## 2025-01-18 ENCOUNTER — Ambulatory Visit: Admitting: Neurology
